# Patient Record
Sex: Female | Born: 1965 | Race: White | Hispanic: No | Marital: Married | Smoking: Former smoker
Health system: Southern US, Community
[De-identification: ages and names within clinical notes are randomized; demographics above are authoritative.]

## PROBLEM LIST (undated history)

## (undated) DIAGNOSIS — N83209 Unspecified ovarian cyst, unspecified side: Secondary | ICD-10-CM

## (undated) HISTORY — DX: Unspecified ovarian cyst, unspecified side: N83.209

## (undated) HISTORY — PX: ABDOMINAL HYSTERECTOMY: SHX81

## (undated) HISTORY — PX: NOSE SURGERY: SHX723

## (undated) HISTORY — PX: WISDOM TOOTH EXTRACTION: SHX21

---

## 1988-12-07 HISTORY — PX: NOSE SURGERY: SHX723

## 1999-01-13 ENCOUNTER — Inpatient Hospital Stay (HOSPITAL_COMMUNITY): Admission: AD | Admit: 1999-01-13 | Discharge: 1999-01-13 | Payer: Self-pay | Admitting: Obstetrics and Gynecology

## 1999-04-08 ENCOUNTER — Inpatient Hospital Stay (HOSPITAL_COMMUNITY): Admission: AD | Admit: 1999-04-08 | Discharge: 1999-04-11 | Payer: Self-pay | Admitting: Obstetrics and Gynecology

## 1999-04-08 ENCOUNTER — Encounter: Payer: Self-pay | Admitting: Obstetrics and Gynecology

## 1999-04-29 ENCOUNTER — Encounter: Admission: RE | Admit: 1999-04-29 | Discharge: 1999-07-28 | Payer: Self-pay | Admitting: Obstetrics and Gynecology

## 1999-06-22 ENCOUNTER — Inpatient Hospital Stay (HOSPITAL_COMMUNITY): Admission: AD | Admit: 1999-06-22 | Discharge: 1999-06-25 | Payer: Self-pay | Admitting: Obstetrics and Gynecology

## 1999-06-26 ENCOUNTER — Encounter (HOSPITAL_COMMUNITY): Admission: RE | Admit: 1999-06-26 | Discharge: 1999-09-24 | Payer: Self-pay | Admitting: Obstetrics and Gynecology

## 1999-07-28 ENCOUNTER — Other Ambulatory Visit: Admission: RE | Admit: 1999-07-28 | Discharge: 1999-07-28 | Payer: Self-pay | Admitting: Obstetrics and Gynecology

## 1999-10-02 ENCOUNTER — Encounter (HOSPITAL_COMMUNITY): Admission: RE | Admit: 1999-10-02 | Discharge: 1999-12-31 | Payer: Self-pay | Admitting: Obstetrics and Gynecology

## 2001-03-17 ENCOUNTER — Emergency Department (HOSPITAL_COMMUNITY): Admission: EM | Admit: 2001-03-17 | Discharge: 2001-03-17 | Payer: Self-pay | Admitting: Emergency Medicine

## 2001-06-08 ENCOUNTER — Inpatient Hospital Stay (HOSPITAL_COMMUNITY): Admission: AD | Admit: 2001-06-08 | Discharge: 2001-06-08 | Payer: Self-pay | Admitting: Obstetrics & Gynecology

## 2001-06-09 ENCOUNTER — Inpatient Hospital Stay (HOSPITAL_COMMUNITY): Admission: AD | Admit: 2001-06-09 | Discharge: 2001-06-09 | Payer: Self-pay | Admitting: Obstetrics and Gynecology

## 2001-06-18 ENCOUNTER — Inpatient Hospital Stay (HOSPITAL_COMMUNITY): Admission: AD | Admit: 2001-06-18 | Discharge: 2001-06-18 | Payer: Self-pay | Admitting: Obstetrics and Gynecology

## 2001-06-24 ENCOUNTER — Inpatient Hospital Stay (HOSPITAL_COMMUNITY): Admission: AD | Admit: 2001-06-24 | Discharge: 2001-06-24 | Payer: Self-pay | Admitting: *Deleted

## 2001-06-24 ENCOUNTER — Inpatient Hospital Stay (HOSPITAL_COMMUNITY): Admission: AD | Admit: 2001-06-24 | Discharge: 2001-06-29 | Payer: Self-pay | Admitting: Obstetrics and Gynecology

## 2001-06-27 ENCOUNTER — Encounter: Payer: Self-pay | Admitting: Obstetrics and Gynecology

## 2001-07-08 ENCOUNTER — Inpatient Hospital Stay (HOSPITAL_COMMUNITY): Admission: AD | Admit: 2001-07-08 | Discharge: 2001-07-08 | Payer: Self-pay | Admitting: Obstetrics and Gynecology

## 2001-07-10 ENCOUNTER — Inpatient Hospital Stay (HOSPITAL_COMMUNITY): Admission: AD | Admit: 2001-07-10 | Discharge: 2001-07-12 | Payer: Self-pay | Admitting: Obstetrics & Gynecology

## 2001-07-13 ENCOUNTER — Encounter: Admission: RE | Admit: 2001-07-13 | Discharge: 2001-08-12 | Payer: Self-pay | Admitting: Obstetrics & Gynecology

## 2001-08-10 ENCOUNTER — Other Ambulatory Visit: Admission: RE | Admit: 2001-08-10 | Discharge: 2001-08-10 | Payer: Self-pay | Admitting: Obstetrics and Gynecology

## 2001-08-13 ENCOUNTER — Encounter: Admission: RE | Admit: 2001-08-13 | Discharge: 2001-09-12 | Payer: Self-pay | Admitting: Obstetrics & Gynecology

## 2001-10-13 ENCOUNTER — Encounter: Admission: RE | Admit: 2001-10-13 | Discharge: 2001-11-12 | Payer: Self-pay | Admitting: Obstetrics & Gynecology

## 2001-12-13 ENCOUNTER — Encounter: Admission: RE | Admit: 2001-12-13 | Discharge: 2002-01-12 | Payer: Self-pay | Admitting: Obstetrics & Gynecology

## 2002-01-13 ENCOUNTER — Encounter: Admission: RE | Admit: 2002-01-13 | Discharge: 2002-02-12 | Payer: Self-pay | Admitting: Obstetrics & Gynecology

## 2002-03-13 ENCOUNTER — Encounter: Admission: RE | Admit: 2002-03-13 | Discharge: 2002-04-12 | Payer: Self-pay | Admitting: Obstetrics & Gynecology

## 2002-04-07 ENCOUNTER — Emergency Department (HOSPITAL_COMMUNITY): Admission: EM | Admit: 2002-04-07 | Discharge: 2002-04-08 | Payer: Self-pay | Admitting: Emergency Medicine

## 2002-04-08 ENCOUNTER — Encounter: Payer: Self-pay | Admitting: Emergency Medicine

## 2002-05-13 ENCOUNTER — Encounter: Admission: RE | Admit: 2002-05-13 | Discharge: 2002-06-12 | Payer: Self-pay | Admitting: Obstetrics & Gynecology

## 2002-07-13 ENCOUNTER — Encounter: Admission: RE | Admit: 2002-07-13 | Discharge: 2002-08-12 | Payer: Self-pay | Admitting: Obstetrics & Gynecology

## 2002-08-11 ENCOUNTER — Other Ambulatory Visit: Admission: RE | Admit: 2002-08-11 | Discharge: 2002-08-11 | Payer: Self-pay | Admitting: Obstetrics and Gynecology

## 2002-08-13 ENCOUNTER — Encounter: Admission: RE | Admit: 2002-08-13 | Discharge: 2002-09-12 | Payer: Self-pay | Admitting: Obstetrics & Gynecology

## 2002-10-13 ENCOUNTER — Encounter: Admission: RE | Admit: 2002-10-13 | Discharge: 2002-11-12 | Payer: Self-pay | Admitting: Obstetrics & Gynecology

## 2003-02-10 ENCOUNTER — Emergency Department (HOSPITAL_COMMUNITY): Admission: EM | Admit: 2003-02-10 | Discharge: 2003-02-10 | Payer: Self-pay | Admitting: Emergency Medicine

## 2008-12-07 HISTORY — PX: WISDOM TOOTH EXTRACTION: SHX21

## 2011-12-08 HISTORY — PX: ABDOMINAL HYSTERECTOMY: SHX81

## 2012-12-07 HISTORY — PX: COLONOSCOPY: SHX174

## 2014-10-26 ENCOUNTER — Ambulatory Visit: Payer: Self-pay | Admitting: Gynecology

## 2014-10-26 ENCOUNTER — Telehealth: Payer: Self-pay | Admitting: *Deleted

## 2014-10-26 NOTE — Telephone Encounter (Signed)
Pt called to cancel appt she was able to find an oncologist closer at [place] cancelled per pt request

## 2017-03-11 DIAGNOSIS — N838 Other noninflammatory disorders of ovary, fallopian tube and broad ligament: Secondary | ICD-10-CM | POA: Insufficient documentation

## 2017-11-17 DIAGNOSIS — K5904 Chronic idiopathic constipation: Secondary | ICD-10-CM | POA: Insufficient documentation

## 2017-12-30 LAB — HM COLONOSCOPY

## 2021-05-19 ENCOUNTER — Other Ambulatory Visit: Payer: Self-pay

## 2021-05-19 ENCOUNTER — Ambulatory Visit
Admission: RE | Admit: 2021-05-19 | Discharge: 2021-05-19 | Disposition: A | Discharge status: Home / Self Care | Payer: BC Managed Care – PPO | Attending: Family Medicine | Admitting: Family Medicine

## 2021-05-19 ENCOUNTER — Encounter: Payer: Self-pay | Admitting: Family Medicine

## 2021-05-19 ENCOUNTER — Ambulatory Visit
Admission: RE | Admit: 2021-05-19 | Discharge: 2021-05-19 | Disposition: A | Discharge status: Home / Self Care | Payer: BC Managed Care – PPO | Source: Ambulatory Visit | Attending: Family Medicine | Admitting: Family Medicine

## 2021-05-19 ENCOUNTER — Ambulatory Visit: Payer: BC Managed Care – PPO | Admitting: Family Medicine

## 2021-05-19 VITALS — BP 98/64 | HR 84 | Temp 98.3°F | Ht 62.5 in | Wt 166.0 lb

## 2021-05-19 DIAGNOSIS — M533 Sacrococcygeal disorders, not elsewhere classified: Secondary | ICD-10-CM | POA: Insufficient documentation

## 2021-05-19 DIAGNOSIS — M25532 Pain in left wrist: Secondary | ICD-10-CM

## 2021-05-19 DIAGNOSIS — M62838 Other muscle spasm: Secondary | ICD-10-CM

## 2021-05-19 DIAGNOSIS — M4722 Other spondylosis with radiculopathy, cervical region: Secondary | ICD-10-CM | POA: Insufficient documentation

## 2021-05-19 HISTORY — DX: Sacrococcygeal disorders, not elsewhere classified: M53.3

## 2021-05-19 MED ORDER — CYCLOBENZAPRINE HCL 10 MG PO TABS: ORAL_TABLET | ORAL | 0 refills | Status: DC

## 2021-05-19 MED ORDER — MELOXICAM 15 MG PO TABS: 15.0000 mg | ORAL_TABLET | Freq: Every day | ORAL | 0 refills | Status: DC

## 2021-05-19 NOTE — Progress Notes (Signed)
Primary Care / Sports Medicine Office Visit  Patient Information:  Patient ID: Charlene Keith, female DOB: 1966/02/18 Age: 55 y.o. MRN: [id number]   Jean Alejos is a pleasant 55 y.o. female presenting with the following:  Chief Complaint  Patient presents with   New Patient (Initial Visit)   Shoulder Pain    Left; x3 weeks ago, occurred when moving boxes; patient went to chiropractor for neck alignment; saw PCP in [facility] who gave patient prednisone with no relief; right-handedness; 6-8/10 pain   Back Pain    Lower; new onset, x2 days; no known injury; history of fractured coccyx when patient was 20 and was bedridden for 6 months; uses Salonpas  and Tylenol extra strength for relief; 5/10 pain    Review of Systems pertinent details above   Patient Active Problem List   Diagnosis Date Noted   Pain of left sacroiliac joint 05/19/2021   Cervical paraspinal muscle spasm 05/19/2021   Chronic idiopathic constipation 11/17/2017   Ovarian mass 03/11/2017   Past Medical History:  Diagnosis Date   Ovarian cyst    Outpatient Encounter Medications as of 05/19/2021  Medication Sig   Cholecalciferol 50 MCG (2000 UT) CAPS Take 1 capsule by mouth daily.   cyclobenzaprine (FLEXERIL) 10 MG tablet One half to one tab PO qHS, then increase gradually to one tab TID.   estradiol (CLIMARA - DOSED IN MG/24 HR) 0.05 mg/24hr patch Place 1 patch onto the skin once a week.   fluticasone (CUTIVATE) 0.005 % ointment Apply 1 application topically as needed.   Magnesium 500 MG TABS Take 500 mg by mouth daily.   meloxicam (MOBIC) 15 MG tablet Take 1 tablet (15 mg total) by mouth daily.   tretinoin (RETIN-A) 0.025 % cream Apply 1 application topically at bedtime.   vitamin B-12 (CYANOCOBALAMIN) 1000 MCG tablet Take 1,000 mcg by mouth daily.   No facility-administered encounter medications on file as of 05/19/2021.   Past Surgical History:  Procedure Laterality Date   ABDOMINAL HYSTERECTOMY     NOSE  SURGERY     WISDOM TOOTH EXTRACTION Bilateral     Vitals:   05/19/21 1340  BP: 98/64  Pulse: 84  Temp: 98.3 F (36.8 C)  SpO2: 96%   Vitals:   05/19/21 1340  Weight: 166 lb (75.3 kg)  Height: 5' 2.5" (1.588 m)   Body mass index is 29.88 kg/m.  No results found.   Independent interpretation of notes and tests performed by another provider:   None  Procedures performed:   None  Pertinent History, Exam, Impression, and Recommendations:   Pain of left sacroiliac joint Patient with stated history of low back pain/chronic issue with recent exacerbation that she attributes to recent move to the area.  She also gives a history of coccyx fracture as a child.  She denies any significant radiation of symptoms, no paresthesias, no weakness in the lower extremities.  Physical exam is significant for focal left SI joint pain, paraspinal right greater than left lower lumbar pain, tenderness at the gluteus medius insertion at the greater trochanter and on the greater trochanter of the left hip.  Straight leg raise testing is negative bilaterally.  Her clinical features are most consistent with lumbosacral etiology with secondary/compensatory changes as demonstrated above.  Plan for lumbar films, scheduled meloxicam and cyclobenzaprine, relative rest, and follow-up.  Pending focality of symptoms at return and x-ray results, can consider local ultrasound-guided injections.  Once appropriate, patient would benefit from home-based or  formal physical therapy.  Cervical paraspinal muscle spasm Patient with several weeks of stated left "shoulder pain".  Some radiation to the left pectoralis and left neck, this is in the setting of stated chronic issues at the cervical spine.  She has noted involvement into the forearm but is actively dealing with carpal tunnel syndrome ipsilaterally, per patient.  Her physical exam findings reveal full painless left shoulder motion, no discomfort with resisted  rotator cuff testing, negative impingement, and tenderness at the left trapezius.  Cervical range of motion is full with mild pain localized to the left trapezius, negative Spurling's bilaterally.  Given her history and findings today, concern for underlying cervical etiology manifesting with left paraspinal cervical muscle spasm.  Plan for x-rays of the cervical spine and left shoulder, interim scheduled meloxicam and cyclobenzaprine, relative rest, and close follow-up in 2 weeks.  Pending radiographic findings and her clinical status at return, can consider local injections.  Once appropriate, she would benefit from home-based for formal physical therapy.    Orders & Medications Meds ordered this encounter  Medications   meloxicam (MOBIC) 15 MG tablet    Sig: Take 1 tablet (15 mg total) by mouth daily.    Dispense:  30 tablet    Refill:  0   cyclobenzaprine (FLEXERIL) 10 MG tablet    Sig: One half to one tab PO qHS, then increase gradually to one tab TID.    Dispense:  30 tablet    Refill:  0   Orders Placed This Encounter  Procedures   DG Cervical Spine Complete   DG Shoulder Left   DG Lumbar Spine Complete   DG Wrist Complete Left     Return in about 2 weeks (around 06/02/2021) for 40 minutes for possible injections.     Montel Culver, MD   Primary Care Sports [facility] [place]

## 2021-05-19 NOTE — Patient Instructions (Signed)
-   Start meloxicam and dose once daily until follow-up (take with food) - Start nightly cyclobenzaprine, side effect and be drowsiness, additional doses can be taken every 8 hours on as-needed basis - Obtain x-rays with the orders provided - Gentle activity using symptoms as a guide - return for follow-up in 2 weeks, contact for questions between now

## 2021-05-19 NOTE — Assessment & Plan Note (Signed)
Patient with stated history of low back pain/chronic issue with recent exacerbation that she attributes to recent move to the area.  She also gives a history of coccyx fracture as a child.  She denies any significant radiation of symptoms, no paresthesias, no weakness in the lower extremities.  Physical exam is significant for focal left SI joint pain, paraspinal right greater than left lower lumbar pain, tenderness at the gluteus medius insertion at the greater trochanter and on the greater trochanter of the left hip.  Straight leg raise testing is negative bilaterally.  Her clinical features are most consistent with lumbosacral etiology with secondary/compensatory changes as demonstrated above.  Plan for lumbar films, scheduled meloxicam and cyclobenzaprine, relative rest, and follow-up.  Pending focality of symptoms at return and x-ray results, can consider local ultrasound-guided injections.  Once appropriate, patient would benefit from home-based or formal physical therapy.

## 2021-05-19 NOTE — Assessment & Plan Note (Signed)
Patient with several weeks of stated left "shoulder pain".  Some radiation to the left pectoralis and left neck, this is in the setting of stated chronic issues at the cervical spine.  She has noted involvement into the forearm but is actively dealing with carpal tunnel syndrome ipsilaterally, per patient.  Her physical exam findings reveal full painless left shoulder motion, no discomfort with resisted rotator cuff testing, negative impingement, and tenderness at the left trapezius.  Cervical range of motion is full with mild pain localized to the left trapezius, negative Spurling's bilaterally.  Given her history and findings today, concern for underlying cervical etiology manifesting with left paraspinal cervical muscle spasm.  Plan for x-rays of the cervical spine and left shoulder, interim scheduled meloxicam and cyclobenzaprine, relative rest, and close follow-up in 2 weeks.  Pending radiographic findings and her clinical status at return, can consider local injections.  Once appropriate, she would benefit from home-based for formal physical therapy.

## 2021-05-19 NOTE — Assessment & Plan Note (Signed)
>>  ASSESSMENT AND PLAN FOR CERVICAL PARASPINAL MUSCLE SPASM WRITTEN ON 05/19/2021  2:59 PM BY Nira Visscher, Ocie Bob, MD  Patient with several weeks of stated left "shoulder pain".  Some radiation to the left pectoralis and left neck, this is in the setting of stated chronic issues at the cervical spine.  She has noted involvement into the forearm but is actively dealing with carpal tunnel syndrome ipsilaterally, per patient.  Her physical exam findings reveal full painless left shoulder motion, no discomfort with resisted rotator cuff testing, negative impingement, and tenderness at the left trapezius.  Cervical range of motion is full with mild pain localized to the left trapezius, negative Spurling's bilaterally.  Given her history and findings today, concern for underlying cervical etiology manifesting with left paraspinal cervical muscle spasm.  Plan for x-rays of the cervical spine and left shoulder, interim scheduled meloxicam and cyclobenzaprine, relative rest, and close follow-up in 2 weeks.  Pending radiographic findings and her clinical status at return, can consider local injections.  Once appropriate, she would benefit from home-based for formal physical therapy.

## 2021-05-21 ENCOUNTER — Telehealth: Payer: Self-pay | Admitting: Family Medicine

## 2021-05-21 NOTE — Telephone Encounter (Signed)
See X-Ray result note in patient's chart.    Copied from [place] [id number]. Topic: General - Call Back - No Documentation >> May 21, 2021 10:24 AM Parke Poisson wrote: Reason for CRM:Pt states she is returning call to office.Possibly may be concerning her x ray result per pt

## 2021-05-23 NOTE — Progress Notes (Signed)
Per Epic patient reviews labs on my chart.

## 2021-06-02 ENCOUNTER — Ambulatory Visit: Payer: BC Managed Care – PPO | Admitting: Family Medicine

## 2021-06-02 ENCOUNTER — Encounter: Payer: Self-pay | Admitting: Family Medicine

## 2021-06-02 ENCOUNTER — Other Ambulatory Visit: Payer: Self-pay

## 2021-06-02 VITALS — BP 98/62 | HR 88 | Temp 98.2°F | Ht 62.5 in | Wt 167.0 lb

## 2021-06-02 DIAGNOSIS — M47812 Spondylosis without myelopathy or radiculopathy, cervical region: Secondary | ICD-10-CM

## 2021-06-02 DIAGNOSIS — M25532 Pain in left wrist: Secondary | ICD-10-CM

## 2021-06-02 DIAGNOSIS — M62838 Other muscle spasm: Secondary | ICD-10-CM

## 2021-06-02 DIAGNOSIS — G5602 Carpal tunnel syndrome, left upper limb: Secondary | ICD-10-CM | POA: Insufficient documentation

## 2021-06-02 DIAGNOSIS — M25332 Other instability, left wrist: Secondary | ICD-10-CM

## 2021-06-02 DIAGNOSIS — M4722 Other spondylosis with radiculopathy, cervical region: Secondary | ICD-10-CM | POA: Insufficient documentation

## 2021-06-02 DIAGNOSIS — M533 Sacrococcygeal disorders, not elsewhere classified: Secondary | ICD-10-CM

## 2021-06-02 MED ORDER — CYCLOBENZAPRINE HCL 10 MG PO TABS: 10.0000 mg | ORAL_TABLET | Freq: Every evening | ORAL | 0 refills | Status: DC | PRN

## 2021-06-02 MED ORDER — MELOXICAM 15 MG PO TABS: 15.0000 mg | ORAL_TABLET | Freq: Every day | ORAL | 0 refills | Status: DC | PRN

## 2021-06-02 NOTE — Assessment & Plan Note (Signed)
Radiographs reveal subtle degenerative changes at the left inferior SI joint, on physical exam she has mild, improved, tenderness at bilateral SI joints.  Is a chronic issue with recent improvement.  I have advised her to monitor this issue given the limited symptomatology and reassuring physical exam findings.  She can contact us on an as-needed basis for this, if the need arises she may benefit from ultrasound-guided SI joint injection(s)

## 2021-06-02 NOTE — Assessment & Plan Note (Signed)
This has improved, physical exam findings are consistent with same, as needed cyclobenzaprine as an adjunct to physical therapy advised.

## 2021-06-02 NOTE — Assessment & Plan Note (Signed)
Patient with chronic history of left carpal tunnel syndrome, recent radiographs reveal scapholunate space widening which may be a confounding element in her wrist mechanics and resultant median nerve involvement.  I have advised continued night splinting, wean from wakeful hour brace usage as she progresses with physical therapy.  A referral to physical therapy was placed.  She can also continue meloxicam on an as-needed basis.  She will return for follow-up in 6 weeks for reevaluation, if persistent symptomatology noted without adequate response, can consider ultrasound-guided median nerve Hydro dissection.

## 2021-06-02 NOTE — Progress Notes (Addendum)
Primary Care / Sports Medicine Office Visit  Patient Information:  Patient ID: Charlene Keith, female DOB: 1966/03/20 Age: 55 y.o. MRN: [id number]   Charlene Keith is a pleasant 55 y.o. 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   estradiol (CLIMARA - DOSED IN MG/24 HR) 0.05 mg/24hr patch Place 1 patch onto the skin once a week.   fluticasone (CUTIVATE) 0.005 % ointment Apply 1 application topically as needed.   Magnesium 500 MG TABS Take 500 mg by mouth daily.   tretinoin (RETIN-A) 0.025 % cream Apply 1 application topically at bedtime.   vitamin B-12 (CYANOCOBALAMIN) 1000 MCG tablet Take 1,000 mcg by mouth daily.   [DISCONTINUED] cyclobenzaprine (FLEXERIL) 10 MG tablet One half to one tab PO qHS, then increase gradually to one tab TID.   [DISCONTINUED] meloxicam (MOBIC) 15 MG tablet Take 1 tablet (15 mg total) by mouth daily.   cyclobenzaprine (FLEXERIL) 10 MG tablet Take 1 tablet (10 mg total) by mouth at bedtime as needed for  muscle spasms. One half to one tab PO qHS, then increase gradually to one tab TID.   meloxicam (MOBIC) 15 MG tablet Take 1 tablet (15 mg total) by mouth daily as needed for pain.   No facility-administered encounter medications on file as of 06/02/2021.   Past Surgical History:  Procedure Laterality Date   ABDOMINAL HYSTERECTOMY     NOSE SURGERY     WISDOM TOOTH EXTRACTION Bilateral     Vitals:   06/02/21 0941  BP: 98/62  Pulse: 88  Temp: 98.2 F (36.8 C)  SpO2: 97%   Vitals:   06/02/21 0941  Weight: 167 lb (75.8 kg)  Height: 5' 2.5" (1.588 m)   Body mass index is 30.06 kg/m.  DG Cervical Spine Complete  Result Date: 05/20/2021 CLINICAL DATA:  Paraspinal left greater than right spasm. EXAM: CERVICAL SPINE - COMPLETE 4+ VIEW COMPARISON:  None. FINDINGS: Slight reversal of the normal cervical lordosis. Slight retrolisthesis of C5 on C6, likely degenerative given degenerative changes at this level. Lateral masses of C1 and C2 are aligned on the open-mouth odontoid view. Mild height loss of the T1 vertebral body anteriorly. Vertebral body heights are maintained. Moderate degenerative disc disease at C5-C6 with disc height loss and endplate spurring. Prevertebral soft tissues are within normal limits. IMPRESSION: 1. Mild height loss of the T1 vertebral body anteriorly, which could represent degenerative remodeling or age-indeterminant superior endplate fracture. If the patient is focally tender or has a history of recent trauma then a CT  or MRI of the thoracic spine is recommended to further evaluate. 2. Moderate degenerative disc disease at C5-C6. Electronically Signed   By: Margaretha Sheffield MD   On: 05/20/2021 09:17   DG Lumbar Spine Complete  Result Date: 05/20/2021 CLINICAL DATA:  Low back pain after bending over yesterday. EXAM: LUMBAR SPINE - COMPLETE 4+ VIEW COMPARISON:  None. FINDINGS: Five lumbar type vertebral bodies. No acute fracture or subluxation. Vertebral body heights are  preserved. Alignment is normal. Mild disc height loss at T11-T12, L3-L4, and L4-L5. Mild right facet arthropathy at L4-L5. The sacroiliac joints are unremarkable. IMPRESSION: 1. Mild degenerative disc disease at L3-L4 and L4-L5. Electronically Signed   By: Titus Dubin M.D.   On: 05/20/2021 09:15   DG Wrist Complete Left  Result Date: 05/20/2021 CLINICAL DATA:  Left wrist pain. EXAM: LEFT WRIST - COMPLETE 3+ VIEW COMPARISON:  None. FINDINGS: No acute fracture or dislocation. Widening of the scapholunate interval with borderline increased scapholunate angle. Joint spaces are preserved. Bone mineralization is normal. Soft tissues are unremarkable. IMPRESSION: 1. Findings suggestive of scapholunate ligament injury. Electronically Signed   By: Titus Dubin M.D.   On: 05/20/2021 09:18   DG Shoulder Left  Result Date: 05/20/2021 CLINICAL DATA:  Left shoulder pain for the past 3 weeks after moving and lifting. EXAM: LEFT SHOULDER - 2+ VIEW COMPARISON:  None. FINDINGS: There is no evidence of fracture or dislocation. There is no evidence of arthropathy or other focal bone abnormality. Soft tissues are unremarkable. IMPRESSION: Negative. Electronically Signed   By: Titus Dubin M.D.   On: 05/20/2021 09:14     Independent interpretation of notes and tests performed by another provider:   Independent interpretation of the cervical spine films dated 05/19/2021 reveal straightening and near reversal of the expected cervical lordosis, focal intervertebral narrowing at C5-C6 with subtle anterior endplate osteophyte, subtle right-sided foraminal narrowing at the same levels, no acute osseous process identified  Independent interpretation of left shoulder x-ray dated 05/19/2021 reveals no significant degenerative changes at the acromioclavicular or glenohumeral articulations, no malalignment, type I acromion, no acute osseous process identified  Independent interpretation of lumbar spine x-ray dated 05/19/2021  reveals subtle cortical roughening at the inferior aspect of the left SI joint consistent with degenerative changes, anterior endplate osteophyte at Y3-0 and facet hypertrophy with sclerosis at L4-5 and L5-S1, no acute osseous process identified  Independent interpretation of left wrist x-ray dated 05/19/2021 reveals widening of the scapholunate junction, subtle first [facility] degenerative changes   Procedures performed:   None  Pertinent History, Exam, Impression, and Recommendations:   Carpal tunnel syndrome of left wrist Patient with chronic history of left carpal tunnel syndrome, recent radiographs reveal scapholunate space widening which may be a confounding element in her wrist mechanics and resultant median nerve involvement.  I have advised continued night splinting, wean from wakeful hour brace usage as she progresses with physical therapy.  A referral to physical therapy was placed.  She can also continue meloxicam on an as-needed basis.  She will return for follow-up in 6 weeks for reevaluation, if persistent symptomatology noted without adequate response, can consider ultrasound-guided median nerve Hydro dissection.  Cervical spondylosis Patient has demonstrated excellent interval response from neck/shoulder reported pain, recent radiographs reveal focal intervertebral involvement at C5-6, her physical exam shows full painless cervical and shoulder range of motion and she is nontender throughout.  She does give history of chronicity of this issue ongoing for years, currently improved.  Given her excellent  clinical picture today I have advised her on several maintenance strategies and she is amenable to formal physical therapy, home-based rehab, and as needed meloxicam and cyclobenzaprine.  Additional refills were prescribed and appropriate dosing discussed at length, they are to serve as an adjunct to her physical therapy course.  She will return for follow-up in 6 weeks for  reevaluation.  Pain of left sacroiliac joint Radiographs reveal subtle degenerative changes at the left inferior SI joint, on physical exam she has mild, improved, tenderness at bilateral SI joints.  Is a chronic issue with recent improvement.  I have advised her to monitor this issue given the limited symptomatology and reassuring physical exam findings.  She can contact us on an as-needed basis for this, if the need arises she may benefit from ultrasound-guided SI joint injection(s)  Cervical paraspinal muscle spasm This has improved, physical exam findings are consistent with same, as needed cyclobenzaprine as an adjunct to physical therapy advised.  Scapholunate dissociation of left wrist Recent radiographs do reveal widening of the scapholunate junction on the left wrist, she does have mild focal tenderness at her midcarpal region however her exam is most consistent with carpal tunnel syndrome with the incidentally noted scapholunate widening being a possible confounding/contributory factor.  I have advised formal physical therapy and appropriate brace usage, we can review her symptoms and response in 6 weeks.  Pending clinical features at her return, we can determine the benefit of ultrasound-guided scapholunate injection and/or median nerve Hydro dissection.    Orders & Medications Meds ordered this encounter  Medications   meloxicam (MOBIC) 15 MG tablet    Sig: Take 1 tablet (15 mg total) by mouth daily as needed for pain.    Dispense:  30 tablet    Refill:  0   cyclobenzaprine (FLEXERIL) 10 MG tablet    Sig: Take 1 tablet (10 mg total) by mouth at bedtime as needed for muscle spasms. One half to one tab PO qHS, then increase gradually to one tab TID.    Dispense:  30 tablet    Refill:  0   Orders Placed This Encounter  Procedures   Ambulatory referral to Physical Therapy     Return in about 6 weeks (around 07/14/2021).     Montel Culver, MD   Primary Care Sports  Medicine [place]

## 2021-06-02 NOTE — Assessment & Plan Note (Signed)
Recent radiographs do reveal widening of the scapholunate junction on the left wrist, she does have mild focal tenderness at her midcarpal region however her exam is most consistent with carpal tunnel syndrome with the incidentally noted scapholunate widening being a possible confounding/contributory factor.  I have advised formal physical therapy and appropriate brace usage, we can review her symptoms and response in 6 weeks.  Pending clinical features at her return, we can determine the benefit of ultrasound-guided scapholunate injection and/or median nerve Hydro dissection.

## 2021-06-02 NOTE — Assessment & Plan Note (Addendum)
>>  ASSESSMENT AND PLAN FOR CERVICAL SPONDYLOSIS WITH RADICULOPATHY WRITTEN ON 06/02/2021 10:29 AM BY Kylan Liberati, Ocie Bob, MD  Patient has demonstrated excellent interval response from neck/shoulder reported pain, recent radiographs reveal focal intervertebral involvement at C5-6, her physical exam shows full painless cervical and shoulder range of motion and she is nontender throughout.  She does give history of chronicity of this issue ongoing for years, currently improved.  Given her excellent clinical picture today I have advised her on several maintenance strategies and she is amenable to formal physical therapy, home-based rehab, and as needed meloxicam and cyclobenzaprine.  Additional refills were prescribed and appropriate dosing discussed at length, they are to serve as an adjunct to her physical therapy course.  She will return for follow-up in 6 weeks for reevaluation.  >>ASSESSMENT AND PLAN FOR CERVICAL PARASPINAL MUSCLE SPASM WRITTEN ON 06/02/2021 10:31 AM BY Haly Feher, Ocie Bob, MD  This has improved, physical exam findings are consistent with same, as needed cyclobenzaprine as an adjunct to physical therapy advised.

## 2021-06-02 NOTE — Patient Instructions (Addendum)
-   Start physical therapy for neck and wrist - Use meloxicam and cyclobenzaprine on an as-needed basis - Contact for questions - Return in 6 weeks  Follow-up with OBGYN: OBGYN Dr. Alanda Slim Cousins   [phone]

## 2021-06-11 ENCOUNTER — Ambulatory Visit: Payer: BC Managed Care – PPO | Attending: Family Medicine

## 2021-06-11 ENCOUNTER — Other Ambulatory Visit: Payer: Self-pay

## 2021-06-11 DIAGNOSIS — M79642 Pain in left hand: Secondary | ICD-10-CM | POA: Insufficient documentation

## 2021-06-11 DIAGNOSIS — M79602 Pain in left arm: Secondary | ICD-10-CM | POA: Diagnosis present

## 2021-06-11 DIAGNOSIS — M6281 Muscle weakness (generalized): Secondary | ICD-10-CM | POA: Insufficient documentation

## 2021-06-11 NOTE — Patient Instructions (Addendum)
Access Code: [email or web address] URL: [email or web address] Date: 06/11/2021 Prepared by: Roxana Hires  Exercises Doorway Pec Stretch at 60 Degrees Abduction with Arm Straight (Mirrored) - 2 x daily - 7 x weekly - 3 reps - 30s hold Doorway Pec Stretch at 120 Elevation with Arm Straight (Mirrored) - 2 x daily - 7 x weekly - 3 reps - 30s hold Median Nerve Flossing - Tray - 2 x daily - 7 x weekly - 2 sets - 10 reps - 5s hold

## 2021-06-11 NOTE — Therapy (Signed)
[facility] [address] [place], [place], [address] Phone: [phone]   Fax:  [phone]  Physical Therapy Evaluation  Patient Details  Name: Charlene Keith MRN: [id number] Date of Birth: April 05, 1966 Referring Provider (PT): Dr. Zigmund Daniel  Encounter Date: 06/11/2021   PT End of Session - 06/13/21 1548     Visit Number 1    Number of Visits 17    Date for PT Re-Evaluation 08/06/21    Authorization Type eval; 06/11/21    PT Start Time 1400    PT Stop Time 1450    PT Time Calculation (min) 50 min    Activity Tolerance Patient tolerated treatment well    Behavior During Therapy [facility] for tasks assessed/performed             Past Medical History:  Diagnosis Date   Ovarian cyst     Past Surgical History:  Procedure Laterality Date   ABDOMINAL HYSTERECTOMY     NOSE SURGERY     WISDOM TOOTH EXTRACTION Bilateral     There were no vitals filed for this visit.    Subjective Assessment - 06/13/21 1547     Subjective LUE pain    Pertinent History Pt states that she moved into a new house the first week in June. The week prior she was packing up her belongings when she started to experience LUE pain. She has continued to experience L arm pain which is particularly aggravated when lifting with her left upper extremity.  She currently complains of left upper extremity weakness as well.  Pain has improved slightly since it started but is still intermittently painful. She also notes that the pain worsens when it is cold, when she is driving, and when she lays on her L side. Symptoms improve with TENS, medication (Tylenol and Mobic), stretching her L shoulder, ice, massage, and keeping her arm covered with warm clothing. She complains of numbness and tingling in digits 4-5 on her L hand. Approximately 1 month ago she was bit by a spider on her L hand causing pain and swelling. She was prescribed steroids and symptoms have since resolved.  She  saw Dr. Rosette Reveal who diagnosed her with cervical spondylosis (chronic) as well as carpal tunnel syndrome of her left wrist.  Per medical notes recent radiographs reveal scapholunate space widening which may be a confounding element in her wrist mechanics and resultant median nerve involvement.  She was advised to utilize night splinting and wean from wakeful hour brace usage as she progresses with PT. Recent cervical radiographs revealed focal intervertebral involvement at C5-6.  She has seen a chiropractor for her neck pain with the most recent visit being May 16, 2021.  She describes receiving what sounds like cervical traction but it did not have any effect on her left upper extremity pain.  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    Limitations Lifting    Diagnostic tests See history    Patient Stated Goals Decreased LUE pain and improve strength    Currently in Pain? Yes    Pain Score 5    Worst: 10/10, best: 0/10   Pain Location Arm    Pain Orientation Left    Pain Descriptors / Indicators Aching    Pain Type Chronic pain    Pain Radiating Towards down LUE but unclear if it is constant  or starts in one location and runs down the arm    Pain Onset More than a month ago    Pain Frequency Intermittent    Aggravating Factors  lifting, laying on L side, driving, cold air    Pain Relieving Factors TENS, medication (Tylenol and Mobic), stretching L shoulder, ice, massage, putting on a sweater.                 [facility] PT Assessment - 06/13/21 1548       Assessment   Medical Diagnosis Cervical spondylosis, scapholunate dissociation of left wrist, carpal tunnel syndrome of left wrist    Referring Provider (PT) Dr. Zigmund Daniel    Onset Date/Surgical Date 04/28/21   Approximate   Hand Dominance Right   Patient also uses left hand for some activities   Next MD Visit 07/14/2021    Prior  Therapy None for this issue      Precautions   Precautions None      Restrictions   Weight Bearing Restrictions No      Balance Screen   Has the patient fallen in the past 6 months No                SUBJECTIVE   History: Pt states that she moved into a new house the first week in June. The week prior she was packing up her belongings when she started to experience LUE pain. She has continued to experience L arm pain which is particularly aggravated when lifting with her left upper extremity.  She currently complains of left upper extremity weakness as well.  Pain has improved slightly since it started but is still intermittently painful. She also notes that the pain worsens when it is cold, when she is driving, and when she lays on her L side. Symptoms improve with TENS, medication (Tylenol and Mobic), stretching her L shoulder, ice, massage, and keeping her arm covered with warm clothing. She complains of numbness and tingling in digits 4-5 on her L hand. Approximately 1 month ago she was bit by a spider on her L hand causing pain and swelling. She was prescribed steroids and symptoms have since resolved.  She saw Dr. Rosette Reveal who diagnosed her with cervical spondylosis (chronic) as well as carpal tunnel syndrome of her left wrist.  Per medical notes recent radiographs reveal scapholunate space widening which may be a confounding element in her wrist mechanics and resultant median nerve involvement.  She was advised to utilize night splinting and wean from wakeful hour brace usage as she progresses with PT. Recent cervical radiographs revealed focal intervertebral involvement at C5-6.  She has seen a chiropractor for her neck pain with the most recent visit being May 16, 2021.  She describes receiving what sounds like cervical traction but it did not have any effect on her left upper extremity pain.  She has a history of chronic low back pain with left SIJ involvement as well as a  history of R carpal tunnel syndrome which has been improving.  Recent shoulder/elbow/hand trauma: No Prior history of LUE injury or pain: No Imaging: Yes, see history  Pain quality: aching Pain: Present: 5/10, Best: 0/10, Worst: 10/10; Aggravating factors: lifting, laying on L side, driving, cold air Easing factors: TENS, medication (Tylenol and Mobic), stretching L shoulder, ice, massage, putting on a sweater.  Improving/Worsening: Symptoms are improving;  24 hour pain behavior: worse at the end of the day Radiating pain: Yes, down LUE to the hand; Numbness/Tingling:  Yes, tingling in digits 4 and 5 of L hand Follow-up appointment with MD: Yes, August 8th Dominant hand: right but also uses L hand for a lot of activities.     OBJECTIVE  Mental Status Patient is oriented to person, place and time.  Recent memory is intact.  Remote memory is intact.  Attention span and concentration are intact.  Expressive speech is intact.  Patient's fund of knowledge is within normal limits for educational level.   MUSCULOSKELETAL: Tremor: None Bulk: Normal Tone: Normal  Posture Mild forward head and rounded shoulders in sitting. No gross asymmetry noted with shoulder height or resting shoulder position from left to right;    Strength R/L 4+/4* Shoulder flexion (anterior deltoid/pec major/coracobrachialis, axillary n. (C5/6) and musculocutaneous n. (C5-7)) 5/4+* Shoulder abduction (deltoid/supraspinatus, axillary/suprascapular n, C5) Deferred: Shoulder external rotation (infraspinatus/teres minor) Deferred: Shoulder internal rotation (subcapularis/lats/pec major) Deferred: Shoulder extension (posterior deltoid, lats, teres major, axillary/thoracodorsal n.) Deferred: Shoulder horizontal abduction 5/4+ Elbow flexion (biceps brachii, brachialis, brachioradialis, musculoskeletal n, C5/6) 5/4+ Elbow extension (triceps, radial n, C7) 5/4+ Forearm Pronation 5/4+ Forearm Supination 5/5 Wrist  Extension (C6/7) 5/5 Wrist Flexion (C6/7) 5/5 Finger adduction (interossei, ulnar n, T1) 5/5 Finger abduction Thumb extension: weak on L side; Cervical isometrics are strong and painless in all directions; Grip strength: R: 39.6, 33.0, 30.6 (34.4#)  L: 21.8, 12.3, 16.4 (16.9#)  AROM Full and painless cervical, L shoulder, and L elbow AROM with overpressure in all planes.   Sensation Fully intact sensation to entire RUE. Pt reports diminished sensation with light touch to L C5-T1. Reports significant numbness in digits 3 and 4 of L hand.   Palpation Pt is very tender to palpation along L pec major/minor as well as over L wrist.   Passive Accessory Intervertebral Motion (PAIVM) Grossly normal mobility without reproduction of LUE pain with CPA and R UPA C2-T3. She reports reproduction of LUE pain with L UPA from L4-L6.  Reflex Testing Biceps (C5/6): R: 1+ L: absent Brachioradialis (C5/6): R: 1+ L: 1+ Triceps (C7): R: Not tested L: Not tested  SPECIAL TESTS Spurlings A (ipsilateral lateral flexion/axial compression): R: Negative L: Negative Spurlings B (ipsilateral lateral flexion/contralateral rotation/axial compression): R: Negative L: Negative Distraction Test: Negative  Hoffman Sign (cervical cord compression): R: Negative L: Positive ULTT Median: R: Not examined L: Positive ULTT Ulnar: R: Negative L: Positive ULTT Radial: R: Negative L: Negative Tinel positive for L wrist;     ASSESSMENT Clinical Impression: Pt is a pleasant 55 year-old female referred for cervical spondylosis, scapholunate dissociation of left wrist, and carpal tunnel syndrome of left wrist. She does appear to have symptoms of carpal tunnel in her L hand however arrives today complaining of more generalized pain in entire LUE with numbness to light touch sensation testing along dermatomes C5-T1. She complains particularly of numbness in digits 4 and 5 of L hand. Pt complains of pain in her chest near pec minor  with reproduction of her LUE symptoms during palpation. Spurlings A/B negative and she has full and painless AROM with overpressure of cervical spine, L shoulder, and L elbow. However she does have weakness in L shoulder flexion and abduction as well as L elbow flexion and extension compared to the R side. Her L grip strength is diminished significantly averaging 16.9# compared to 34.4# on the R side. She reports reproduction of her LUE symptoms with L unilateral passive accessory mobility testing of C4-6. Absent L bicep jerk reflex (L brachioradialis is intact). Concern present  for nerve compression either in the neck or more distal along the brachial plexus. Initiated HEP today and pt will return for additional testing at next visit. Pt presents with deficits in strength, sensation, and pain. She will benefit from skilled PT services to address deficits and return to pain-free function at home and work.         Objective measurements completed on examination: See above findings.                 PT Short Term Goals - 06/13/21 1551       PT SHORT TERM GOAL #1   Title Pt will be independent with HEP in order to improve strength and decrease L arm/hand in order to improve pain-free function at home and work.    Time 4    Period Weeks    Status New    Target Date 07/09/21               PT Long Term Goals - 06/13/21 1551       PT LONG TERM GOAL #1   Title Pt will decrease worst L arm/hand pain as reported on NPRS by at least 3 points in order to demonstrate clinically significant reduction in arm/hand pain.    Baseline 06/11/21: worst: 10/10    Time 8    Period Weeks    Status New    Target Date 08/06/21      PT LONG TERM GOAL #2   Title Pt will increase strength of L shoulder flexion and abduction without pain by at least 1/2 MMT grade in order to demonstrate improvement in strength and function.    Baseline 06/11/21: L shoulder flexion: 4/5, L shoulder abduction: 4+/5;     Time 8    Period Weeks    Status New    Target Date 08/06/21      PT LONG TERM GOAL #3   Title Pt will improve FOTO score to at least 71 in order to demonstrate significant improvement in function related to her LUE pain    Baseline 06/11/21: 59    Time 8    Period Weeks    Status New    Target Date 08/06/21                    Plan - 06/13/21 1549     Clinical Impression Statement Pt is a pleasant 55 year-old female referred for cervical spondylosis, scapholunate dissociation of left wrist, and carpal tunnel syndrome of left wrist. She does appear to have symptoms of carpal tunnel in her L hand however arrives today complaining of more generalized pain in entire LUE with numbness to light touch sensation testing along dermatomes C5-T1. She complains particularly of numbness in digits 4 and 5 of L hand. Pt complains of pain in her chest near pec minor with reproduction of her LUE symptoms during palpation. Spurlings A/B negative and she has full and painless AROM with overpressure of cervical spine, L shoulder, and L elbow. However she does have weakness in L shoulder flexion and abduction as well as L elbow flexion and extension compared to the R side. Her L grip strength is diminished significantly averaging 16.9# compared to 34.4# on the R side. She reports reproduction of her LUE symptoms with L unilateral passive accessory mobility testing of C4-6. Absent L bicep jerk reflex (L brachioradialis is intact). Concern present for nerve compression either in the neck or more distal along the brachial plexus. Initiated  HEP today and pt will return for additional testing at next visit. Pt presents with deficits in strength, sensation, and pain. She will benefit from skilled PT services to address deficits and return to pain-free function at home and work.    Personal Factors and Comorbidities Comorbidity 2    Comorbidities cervical spondylosis, R carpal tunnel syndrome     Examination-Activity Limitations Carry;Lift    Examination-Participation Restrictions Cleaning;Community Activity;Occupation    Stability/Clinical Decision Making Evolving/Moderate complexity    Clinical Decision Making Moderate    Rehab Potential Good    PT Frequency 2x / week    PT Duration 8 weeks    PT Treatment/Interventions ADLs/Self Care Home Management;Aquatic Therapy;Canalith Repostioning;Cryotherapy;Electrical Stimulation;Iontophoresis 4mg /ml Dexamethasone;Moist Heat;Traction;Ultrasound;Gait training;Functional mobility training;Therapeutic activities;Therapeutic exercise;Balance training;Neuromuscular re-education;Manual techniques;Dry needling;Vestibular;Spinal Manipulations;Joint Manipulations;Passive range of motion    PT Next Visit Plan Complete QuickDASH, special tests of left shoulder, measure left wrist range of motion, initiate distal median nerve glides at wrist for carpal tunnel, review HEP, manual techniques for median nerve tension, consider trigger point dry needling to pectoralis major/minor as well as scalene stretches and first rib mobilizations    PT Home Exercise Plan Access Code: [email or web address]    Consulted and Agree with Plan of Care Patient             Patient will benefit from skilled therapeutic intervention in order to improve the following deficits and impairments:  Pain, Decreased strength  Visit Diagnosis: Pain in left arm  Pain in left hand  Muscle weakness (generalized)     Problem List Patient Active Problem List   Diagnosis Date Noted   Cervical spondylosis 06/02/2021   Carpal tunnel syndrome of left wrist 06/02/2021   Scapholunate dissociation of left wrist 06/02/2021   Pain of left sacroiliac joint 05/19/2021   Cervical paraspinal muscle spasm 05/19/2021   Chronic idiopathic constipation 11/17/2017   Ovarian mass 03/11/2017   Lyndel Safe Renette Hsu PT, DPT, GCS  Brendalee Matthies 06/13/2021, 4:17 PM  [facility]  [facility] [address] [place], [place], [address] Phone: [phone]   Fax:  [phone]  Name: Rashanna Christiana MRN: [id number] Date of Birth: [birth year]

## 2021-06-17 ENCOUNTER — Ambulatory Visit: Payer: BC Managed Care – PPO

## 2021-06-17 ENCOUNTER — Other Ambulatory Visit: Payer: Self-pay

## 2021-06-17 DIAGNOSIS — M79602 Pain in left arm: Secondary | ICD-10-CM | POA: Diagnosis not present

## 2021-06-17 DIAGNOSIS — M6281 Muscle weakness (generalized): Secondary | ICD-10-CM

## 2021-06-17 NOTE — Patient Instructions (Signed)
Access Code: [email or web address] URL: [email or web address] Date: 06/17/2021 Prepared by: Roxana Hires  Exercises Doorway Pec Stretch at 60 Degrees Abduction with Arm Straight (Mirrored) - 2 x daily - 7 x weekly - 3 reps - 30s hold Doorway Pec Stretch at 120 Elevation with Arm Straight (Mirrored) - 2 x daily - 7 x weekly - 3 reps - 30s hold Median Nerve Flossing - Tray - 2 x daily - 7 x weekly - 2 sets - 10 reps - 5s hold Isometric Shoulder Flexion at Wall - 1 x daily - 7 x weekly - 2 sets - 10 reps - 5s hold Isometric Shoulder External Rotation at Wall - 1 x daily - 7 x weekly - 2 sets - 10 reps - 5s hold Isometric Shoulder Abduction at Wall - 1 x daily - 7 x weekly - 2 sets - 10 reps - 5s hold

## 2021-06-17 NOTE — Therapy (Signed)
[facility] [address] [place], [place], [address] Phone: [phone]   Fax:  [phone]  Physical Therapy Treatment  Patient Details  Name: Charlene Keith MRN: [id number] Date of Birth: May 30, 1966 Referring Provider (PT): Dr. Zigmund Daniel   Encounter Date: 06/17/2021   PT End of Session - 06/17/21 1402     Visit Number 2    Number of Visits 17    Date for PT Re-Evaluation 08/06/21    Authorization Type eval; 06/11/21    PT Start Time 1402    PT Stop Time 1445    PT Time Calculation (min) 43 min    Activity Tolerance Patient tolerated treatment well    Behavior During Therapy [facility] for tasks assessed/performed             Past Medical History:  Diagnosis Date   Ovarian cyst     Past Surgical History:  Procedure Laterality Date   ABDOMINAL HYSTERECTOMY     NOSE SURGERY     WISDOM TOOTH EXTRACTION Bilateral     There were no vitals filed for this visit.   Subjective Assessment - 06/17/21 1401     Subjective Pt reports her L shoulder is feeling much better since the initial evaluation. She has been performing her stretches at home which have been helpful. She denies any resting L shoulder pain upon arrival.    Pertinent History Pt states that she moved into a new house the first week in June. The week prior she was packing up her belongings when she started to experience LUE pain. She has continued to experience L arm pain which is particularly aggravated when lifting with her left upper extremity.  She currently complains of left upper extremity weakness as well.  Pain has improved slightly since it started but is still intermittently painful. She also notes that the pain worsens when it is cold, when she is driving, and when she lays on her L side. Symptoms improve with TENS, medication (Tylenol and Mobic), stretching her L shoulder, ice, massage, and keeping her arm covered with warm clothing. She complains of numbness and  tingling in digits 4-5 on her L hand. Approximately 1 month ago she was bit by a spider on her L hand causing pain and swelling. She was prescribed steroids and symptoms have since resolved.  She saw Dr. Rosette Reveal who diagnosed her with cervical spondylosis (chronic) as well as carpal tunnel syndrome of her left wrist.  Per medical notes recent radiographs reveal scapholunate space widening which may be a confounding element in her wrist mechanics and resultant median nerve involvement.  She was advised to utilize night splinting and wean from wakeful hour brace usage as she progresses with PT. Recent cervical radiographs revealed focal intervertebral involvement at C5-6.  She has seen a chiropractor for her neck pain with the most recent visit being May 16, 2021.  She describes receiving what sounds like cervical traction but it did not have any effect on her left upper extremity pain.  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    Limitations Lifting    Diagnostic tests See history    Patient Stated Goals Decreased LUE pain and improve strength               TREATMENT   Manual Therapy  Pt completed quickDASH: 50% impaired;  SPECIAL TESTS  Rotator Cuff  Drop Arm Test: Negative Painful Arc (Pain from 60 to 120 degrees scaption): Negative Infraspinatus Muscle Test: Negative  Subacromial Impingement Hawkins-Kennedy: Negative Neer (Block scapula, PROM flexion): Negative Painful Arc (Pain from 60 to 120 degrees scaption): Negative Empty Can: Positive External Rotation Resistance: Negative Horizontal Adduction: Positive Scapular Assist: Not examined  Labral Tear Biceps Load II (120 elevation, full ER, 90 elbow flexion, full supination, resisted elbow flexion): Negative Crank (160 scaption, axial load with IR/ER): Negative Active Compression Test: Positive  Bicep Tendon  Pathology Speed (shoulder flexion to 90, external rotation, full elbow extension, and forearm supination with resistance: Positive Yergason's (resisted shoulder ER and supination/biceps tendon pathology): Negative  Shoulder Instability Sulcus Sign: Negative Anterior Apprehension: Negative  AROM: R/L Wrist extension: 64/54  Wrist flexion: 70/50 Ulnar deviation: 52/48 Radial deviation: 34/28  L shoulder median nerve glides x 10; L scalene and L upper trap stretch x 30s each; L shoulder AP mobilizations, grade III, shoulder at  neutral, 30s/bout x 2 bouts; L shoulder inferior mobilziations, grade III, shoulder at 90 abduction, 30s/bout x 1 bout; L shoulder AP mobilizations, grade III, shoulder at 90 abduction and available end range ER 30s/bout x 2 bouts; L shoulder posterior and inferior mobilizations, grade III, shoulder at available end range flexion, 30s/bout x 2 bouts; STM to L pec minor/major while also adding passive L shoulder horizontal adduction as well as flexion and IR/ER;   Ther-ex  Left shoulder isometric abduction, external rotation, and flexion, 5-second hold x5 each;   Trigger Point Dry Needling (TDN), unbilled Education performed with patient regarding potential benefit of TDN. Reviewed precautions and risks with patient. Reviewed special precautions/risks over lung fields which include pneumothorax. Reviewed signs and symptoms of pneumothorax and advised pt to go to ER immediately if these symptoms develop advise them of dry needling treatment. Extensive time spent with pt to ensure full understanding of TDN risks. Pt provided verbal consent to treatment. TDN performed to L upper trap with 1, 0.25 x 60 single needle placement with local twitch response (LTR). Pistoning technique utilized. Pt does not like the dry needling and reports it is uncomfortable. She defers additional placements today.    Patient completed QuickDASH questionnaire which shows 50% disability  related to her left shoulder pain.  Measurements of left wrist range of motion obtained today and she demonstrates decrease in left wrist range of motion in flexion, extension, radial deviation, and ulnar deviation. Additional special testing performed to left shoulder which reveals positive empty can, positive horizontal adduction, and positive Speed test.  Difficult to interpret results special test due to generalized pain around entire left shoulder girdle.  Performed median nerve glides and initiated passive accessory mobilizations and soft tissue mobilization to left shoulder girdle during session today.  She reports improvement in left shoulder mobility with less pain at the end of session.  Attempted trigger point dry needling with one placement to left upper trap however patient reports discomfort during dry needling and defers additional placements today.  Added isometric strengthening to patient's home program and encouraged her to continue stretches at home. Pt will benefit from PT services to address deficits in strength, mobility, and pain in order to return to full function at home.                           PT Short Term Goals - 06/13/21 1551       PT SHORT TERM GOAL #1  Title Pt will be independent with HEP in order to improve strength and decrease L arm/hand in order to improve pain-free function at home and work.    Time 4    Period Weeks    Status New    Target Date 07/09/21               PT Long Term Goals - 06/17/21 1610       PT LONG TERM GOAL #1   Title Pt will decrease worst L arm/hand pain as reported on NPRS by at least 3 points in order to demonstrate clinically significant reduction in arm/hand pain.    Baseline 06/11/21: worst: 10/10    Time 8    Period Weeks    Status New    Target Date 08/06/21      PT LONG TERM GOAL #2   Title Pt will increase strength of L shoulder flexion and abduction without pain by at least 1/2 MMT grade in  order to demonstrate improvement in strength and function.    Baseline 06/11/21: L shoulder flexion: 4/5, L shoulder abduction: 4+/5;    Time 8    Period Weeks    Status New    Target Date 08/06/21      PT LONG TERM GOAL #3   Title Pt will improve FOTO score to at least 71 in order to demonstrate significant improvement in function related to her LUE pain    Baseline 06/11/21: 59    Time 8    Period Weeks    Status New    Target Date 08/06/21      PT LONG TERM GOAL #4   Title Pt will decrease quick DASH score by at least 8% in order to demonstrate clinically significant reduction in disability.    Baseline 06/17/21: 50%    Time 8    Period Weeks    Status New    Target Date 08/06/21                   Plan - 06/17/21 1402     Clinical Impression Statement Patient completed QuickDASH questionnaire which shows 50% disability related to her left shoulder pain.  Measurements of left wrist range of motion obtained today and she demonstrates decrease in left wrist range of motion in flexion, extension, radial deviation, and ulnar deviation. Additional special testing performed to left shoulder which reveals positive empty can, positive horizontal adduction, and positive Speed test.  Difficult to interpret results special test due to generalized pain around entire left shoulder girdle.  Performed median nerve glides and initiated passive accessory mobilizations and soft tissue mobilization to left shoulder girdle during session today.  She reports improvement in left shoulder mobility with less pain at the end of session.  Attempted trigger point dry needling with one placement to left upper trap however patient reports discomfort during dry needling and defers additional placements today.  Added isometric strengthening to patient's home program and encouraged her to continue stretches at home. Pt will benefit from PT services to address deficits in strength, mobility, and pain in order to  return to full function at home.    Personal Factors and Comorbidities Comorbidity 2    Comorbidities cervical spondylosis, R carpal tunnel syndrome    Examination-Activity Limitations Carry;Lift    Examination-Participation Restrictions Cleaning;Community Activity;Occupation    Stability/Clinical Decision Making Evolving/Moderate complexity    Rehab Potential Good    PT Frequency 2x / week    PT Duration 8  weeks    PT Treatment/Interventions ADLs/Self Care Home Management;Aquatic Therapy;Canalith Repostioning;Cryotherapy;Electrical Stimulation;Iontophoresis 4mg /ml Dexamethasone;Moist Heat;Traction;Ultrasound;Gait training;Functional mobility training;Therapeutic activities;Therapeutic exercise;Balance training;Neuromuscular re-education;Manual techniques;Dry needling;Vestibular;Spinal Manipulations;Joint Manipulations;Passive range of motion    PT Next Visit Plan Review HEP, continue manual techniques and strengthening    PT Home Exercise Plan Access Code: [email or web address]    Consulted and Agree with Plan of Care Patient             Patient will benefit from skilled therapeutic intervention in order to improve the following deficits and impairments:  Pain, Decreased strength  Visit Diagnosis: Pain in left arm  Muscle weakness (generalized)     Problem List Patient Active Problem List   Diagnosis Date Noted   Cervical spondylosis 06/02/2021   Carpal tunnel syndrome of left wrist 06/02/2021   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 [place], [place], [address] Phone: [phone]   Fax:  [phone]  Name: Charlene Keith MRN: [id number] Date of Birth: 1966-01-08

## 2021-06-19 ENCOUNTER — Ambulatory Visit: Payer: BC Managed Care – PPO

## 2021-06-19 ENCOUNTER — Other Ambulatory Visit: Payer: Self-pay

## 2021-06-19 DIAGNOSIS — M79602 Pain in left arm: Secondary | ICD-10-CM

## 2021-06-19 DIAGNOSIS — M6281 Muscle weakness (generalized): Secondary | ICD-10-CM

## 2021-06-19 NOTE — Therapy (Signed)
[facility] [address] [place], [place], [address] Phone: [phone]   Fax:  [phone]  Physical Therapy Treatment  Patient Details  Name: Charlene Keith MRN: [id number] Date of Birth: [birth year] Referring Provider (PT): Dr. Zigmund Daniel   Encounter Date: 06/19/2021   PT End of Session - 06/19/21 1455     Visit Number 3    Number of Visits 17    Date for PT Re-Evaluation 08/06/21    Authorization Type eval; 06/11/21    PT Start Time 1405    PT Stop Time 1445    PT Time Calculation (min) 40 min    Activity Tolerance Patient tolerated treatment well    Behavior During Therapy [facility] for tasks assessed/performed             Past Medical History:  Diagnosis Date   Ovarian cyst     Past Surgical History:  Procedure Laterality Date   ABDOMINAL HYSTERECTOMY     NOSE SURGERY     WISDOM TOOTH EXTRACTION Bilateral     There were no vitals filed for this visit.   Subjective Assessment - 06/19/21 1454     Subjective Pt reports her L shoulder continues to improve.  She has been performing her stretches at home which have been helpful. She denies any resting L shoulder pain upon arrival.  She would like to know if there are any exercise she can do on the [employer] at home at home to help with her shoulder.    Pertinent History Pt states that she moved into a new house the first week in June. The week prior she was packing up her belongings when she started to experience LUE pain. She has continued to experience L arm pain which is particularly aggravated when lifting with her left upper extremity.  She currently complains of left upper extremity weakness as well.  Pain has improved slightly since it started but is still intermittently painful. She also notes that the pain worsens when it is cold, when she is driving, and when she lays on her L side. Symptoms improve with TENS, medication (Tylenol and Mobic), stretching her L shoulder,  ice, massage, and keeping her arm covered with warm clothing. She complains of numbness and tingling in digits 4-5 on her L hand. Approximately 1 month ago she was bit by a spider on her L hand causing pain and swelling. She was prescribed steroids and symptoms have since resolved.  She saw Dr. Rosette Reveal who diagnosed her with cervical spondylosis (chronic) as well as carpal tunnel syndrome of her left wrist.  Per medical notes recent radiographs reveal scapholunate space widening which may be a confounding element in her wrist mechanics and resultant median nerve involvement.  She was advised to utilize night splinting and wean from wakeful hour brace usage as she progresses with PT. Recent cervical radiographs revealed focal intervertebral involvement at C5-6.  She has seen a chiropractor for her neck pain with the most recent visit being May 16, 2021.  She describes receiving what sounds like cervical traction but it did not have any effect on her left upper extremity pain.  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    Limitations Lifting    Diagnostic tests See history    Patient Stated Goals Decreased LUE pain and improve strength  TREATMENT   Manual Therapy  L shoulder median nerve glides x 10; L pectoralis minor/major stretches into horizontal abduction 2 x 30 seconds, and overhead 2 x 30 seconds; L scalene and L upper trap stretch 2 x 30s each; L shoulder AP mobilizations, grade III, shoulder at  neutral, 30s/bout x 2 bouts; L shoulder inferior mobilziations, grade III, shoulder at 90 abduction, 30s/bout x 1 bout; L shoulder AP mobilizations, grade III, shoulder at 90 abduction and available end range ER 30s/bout x 2 bouts; L shoulder posterior and inferior mobilizations, grade III, shoulder at available end range flexion, 30s/bout x 2 bouts; Left first rib inferior  mobilizations, grade 3, 30 seconds per bout x2 bouts; Supine T3-5 thoracic opening manipulation; STM to L pec minor/major while also adding passive L shoulder horizontal adduction as well as flexion and IR/ER;   Ther-ex  [employer] low rows x10; [employer] high rows x10; [employer] supine tricep press downs x10; [employer] supine shoulder extension from 90 flexion down to neutral x10;   Pt educated throughout session about proper posture and technique with exercises. Improved exercise technique, movement at target joints, use of target muscles after min to mod verbal, visual, tactile cues.    Patient demonstrates excellent motivation during session today.  Continued with passive accessory mobilizations to left glenohumeral joint and left first rib.  Also continued with left scalene and left upper trap stretches which reproduce patient's radicular symptoms down left upper extremity.  Repeated soft tissue mobilization to left pectoralis minor/major wells median nerve glides.  Patient educated on exercise she can perform the [employer] at home for periscapular strengthening. Pt will benefit from PT services to address deficits in strength, mobility, and pain in order to return to full function at home.                           PT Short Term Goals - 06/13/21 1551       PT SHORT TERM GOAL #1   Title Pt will be independent with HEP in order to improve strength and decrease L arm/hand in order to improve pain-free function at home and work.    Time 4    Period Weeks    Status New    Target Date 07/09/21               PT Long Term Goals - 06/17/21 1610       PT LONG TERM GOAL #1   Title Pt will decrease worst L arm/hand pain as reported on NPRS by at least 3 points in order to demonstrate clinically significant reduction in arm/hand pain.    Baseline 06/11/21: worst: 10/10    Time 8    Period Weeks    Status New    Target Date 08/06/21      PT LONG TERM GOAL #2    Title Pt will increase strength of L shoulder flexion and abduction without pain by at least 1/2 MMT grade in order to demonstrate improvement in strength and function.    Baseline 06/11/21: L shoulder flexion: 4/5, L shoulder abduction: 4+/5;    Time 8    Period Weeks    Status New    Target Date 08/06/21      PT LONG TERM GOAL #3   Title Pt will improve FOTO score to at least 71 in order to demonstrate significant improvement in function related to her LUE pain  Baseline 06/11/21: 59    Time 8    Period Weeks    Status New    Target Date 08/06/21      PT LONG TERM GOAL #4   Title Pt will decrease quick DASH score by at least 8% in order to demonstrate clinically significant reduction in disability.    Baseline 06/17/21: 50%    Time 8    Period Weeks    Status New    Target Date 08/06/21                   Plan - 06/19/21 1455     Clinical Impression Statement Patient demonstrates excellent motivation during session today.  Continued with passive accessory mobilizations to left glenohumeral joint and left first rib.  Also continued with left scalene and left upper trap stretches which reproduce patient's radicular symptoms down left upper extremity.  Repeated soft tissue mobilization to left pectoralis minor/major wells median nerve glides.  Patient educated on exercise she can perform the [employer] at home for periscapular strengthening. Pt will benefit from PT services to address deficits in strength, mobility, and pain in order to return to full function at home.    Personal Factors and Comorbidities Comorbidity 2    Comorbidities cervical spondylosis, R carpal tunnel syndrome    Examination-Activity Limitations Carry;Lift    Examination-Participation Restrictions Cleaning;Community Activity;Occupation    Stability/Clinical Decision Making Evolving/Moderate complexity    Rehab Potential Good    PT Frequency 2x / week    PT Duration 8 weeks    PT Treatment/Interventions  ADLs/Self Care Home Management;Aquatic Therapy;Canalith Repostioning;Cryotherapy;Electrical Stimulation;Iontophoresis 4mg /ml Dexamethasone;Moist Heat;Traction;Ultrasound;Gait training;Functional mobility training;Therapeutic activities;Therapeutic exercise;Balance training;Neuromuscular re-education;Manual techniques;Dry needling;Vestibular;Spinal Manipulations;Joint Manipulations;Passive range of motion    PT Next Visit Plan Review HEP, continue manual techniques and strengthening    PT Home Exercise Plan Access Code: [email or web address]    Consulted and Agree with Plan of Care Patient             Patient will benefit from skilled therapeutic intervention in order to improve the following deficits and impairments:  Pain, Decreased strength  Visit Diagnosis: Pain in left arm  Muscle weakness (generalized)     Problem List Patient Active Problem List   Diagnosis Date Noted   Cervical spondylosis 06/02/2021   Carpal tunnel syndrome of left wrist 06/02/2021   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 [place], [place], [address] Phone: [phone]   Fax:  [phone]  Name: Charlene Keith MRN: [id number] Date of Birth: Aug 09, 1966

## 2021-06-23 ENCOUNTER — Other Ambulatory Visit: Payer: Self-pay

## 2021-06-23 ENCOUNTER — Ambulatory Visit: Payer: BC Managed Care – PPO

## 2021-06-23 DIAGNOSIS — M6281 Muscle weakness (generalized): Secondary | ICD-10-CM

## 2021-06-23 DIAGNOSIS — M79602 Pain in left arm: Secondary | ICD-10-CM | POA: Diagnosis not present

## 2021-06-23 NOTE — Therapy (Signed)
[facility] [address]. [place], [place], [address] Phone: [phone]   Fax:  [phone]  Physical Therapy Treatment  Patient Details  Name: Charlene Keith MRN: [id number] Date of Birth: August 08, 1966 Referring Provider (PT): Dr. Zigmund Daniel   Encounter Date: 06/23/2021   PT End of Session - 06/23/21 1151     Visit Number 4    Number of Visits 17    Date for PT Re-Evaluation 08/06/21    Authorization Type eval; 06/11/21    PT Start Time 1148    PT Stop Time 1230    PT Time Calculation (min) 42 min    Activity Tolerance Patient tolerated treatment well    Behavior During Therapy [facility] for tasks assessed/performed             Past Medical History:  Diagnosis Date   Ovarian cyst     Past Surgical History:  Procedure Laterality Date   ABDOMINAL HYSTERECTOMY     NOSE SURGERY     WISDOM TOOTH EXTRACTION Bilateral     There were no vitals filed for this visit.   Subjective Assessment - 06/23/21 1150     Subjective Pt reports her L shoulder continues to improve.  She has been performing her stretches at home which have been helpful and states that if she performs them every hour it helps her symptoms. She reports 2/10 resting L shoulder pain upon arrival.    Pertinent History Pt states that she moved into a new house the first week in June. The week prior she was packing up her belongings when she started to experience LUE pain. She has continued to experience L arm pain which is particularly aggravated when lifting with her left upper extremity.  She currently complains of left upper extremity weakness as well.  Pain has improved slightly since it started but is still intermittently painful. She also notes that the pain worsens when it is cold, when she is driving, and when she lays on her L side. Symptoms improve with TENS, medication (Tylenol and Mobic), stretching her L shoulder, ice, massage, and keeping her arm covered with warm  clothing. She complains of numbness and tingling in digits 4-5 on her L hand. Approximately 1 month ago she was bit by a spider on her L hand causing pain and swelling. She was prescribed steroids and symptoms have since resolved.  She saw Dr. Rosette Reveal who diagnosed her with cervical spondylosis (chronic) as well as carpal tunnel syndrome of her left wrist.  Per medical notes recent radiographs reveal scapholunate space widening which may be a confounding element in her wrist mechanics and resultant median nerve involvement.  She was advised to utilize night splinting and wean from wakeful hour brace usage as she progresses with PT. Recent cervical radiographs revealed focal intervertebral involvement at C5-6.  She has seen a chiropractor for her neck pain with the most recent visit being May 16, 2021.  She describes receiving what sounds like cervical traction but it did not have any effect on her left upper extremity pain.  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    Limitations Lifting    Diagnostic tests See history    Patient Stated Goals Decreased LUE pain and improve strength               TREATMENT   Manual Therapy  L shoulder median nerve glides x 10; L pectoralis minor/major stretches into horizontal abduction 2 x 30 seconds, and overhead 2 x 30 seconds; L scalene and L upper trap stretch 2 x 30s each; L shoulder AP mobilizations, grade III, shoulder at  neutral, 30s/bout x 2 bouts; L shoulder inferior mobilziations, grade III, shoulder at 90 abduction, 30s/bout x 1 bout; L shoulder AP mobilizations, grade III, shoulder at 90 abduction and available end range ER 30s/bout x 2 bouts; L shoulder posterior and inferior mobilizations, grade III, shoulder at available end range flexion, 30s/bout x 2 bouts; Left first rib inferior mobilizations, grade 3, 30 seconds per bout x2  bouts; STM to L pec minor/major while also adding passive L shoulder horizontal adduction as well as flexion and IR/ER;   Ther-ex  Supine left shoulder serratus punch with manual resistance from therapist x10; Supine left shoulder flexion with 2 pound dumbbell 2x10; Right side-lying left shoulder ER with 2 pound dumbbell 2x10; Supine left shoulder abduction with 2 pound dumbbell 2x10; Seated left distal median nerve glides x10; Seated rows with blue Thera-Band x10; HEP progression provided to patient;   Pt educated throughout session about proper posture and technique with exercises. Improved exercise technique, movement at target joints, use of target muscles after min to mod verbal, visual, tactile cues.    Patient demonstrates excellent motivation during session today.  Continued with passive accessory mobilizations to left glenohumeral joint and left first rib.  Also continued with left scalene and left upper trap stretches which reproduce patient's radicular symptoms down left upper extremity.  Repeated soft tissue mobilization to left pectoralis minor/major wells median nerve glides.  Initiated additional strengthening today including serratus punches and resisted left shoulder flexion, abduction and ER.  Patient educated about how to perform distal median nerve glides for left carpal tunnel.  HEP progression issued at end of session. Pt will benefit from PT services to address deficits in strength, mobility, and pain in order to return to full function at home.                           PT Short Term Goals - 06/13/21 1551       PT SHORT TERM GOAL #1   Title Pt will be independent with HEP in order to improve strength and decrease L arm/hand in order to improve pain-free function at home and work.    Time 4    Period Weeks    Status New    Target Date 07/09/21               PT Long Term Goals - 06/17/21 1610       PT LONG TERM GOAL #1   Title Pt  will decrease worst L arm/hand pain as reported on NPRS by at least 3 points in order to demonstrate clinically significant reduction in arm/hand pain.    Baseline 06/11/21: worst: 10/10    Time 8    Period Weeks    Status New    Target Date 08/06/21      PT LONG TERM GOAL #2   Title Pt will increase strength of L shoulder flexion and abduction without pain by at least 1/2 MMT grade in order to demonstrate improvement in strength and function.    Baseline 06/11/21: L shoulder flexion: 4/5, L shoulder abduction: 4+/5;    Time 8    Period Weeks    Status New    Target  Date 08/06/21      PT LONG TERM GOAL #3   Title Pt will improve FOTO score to at least 71 in order to demonstrate significant improvement in function related to her LUE pain    Baseline 06/11/21: 59    Time 8    Period Weeks    Status New    Target Date 08/06/21      PT LONG TERM GOAL #4   Title Pt will decrease quick DASH score by at least 8% in order to demonstrate clinically significant reduction in disability.    Baseline 06/17/21: 50%    Time 8    Period Weeks    Status New    Target Date 08/06/21                   Plan - 06/23/21 1151     Clinical Impression Statement Patient demonstrates excellent motivation during session today.  Continued with passive accessory mobilizations to left glenohumeral joint and left first rib.  Also continued with left scalene and left upper trap stretches which reproduce patient's radicular symptoms down left upper extremity.  Repeated soft tissue mobilization to left pectoralis minor/major wells median nerve glides.  Initiated additional strengthening today including serratus punches and resisted left shoulder flexion, abduction and ER.  Patient educated about how to perform distal median nerve glides for left carpal tunnel.  HEP progression issued at end of session. Pt will benefit from PT services to address deficits in strength, mobility, and pain in order to return to full  function at home.    Personal Factors and Comorbidities Comorbidity 2    Comorbidities cervical spondylosis, R carpal tunnel syndrome    Examination-Activity Limitations Carry;Lift    Examination-Participation Restrictions Cleaning;Community Activity;Occupation    Stability/Clinical Decision Making Evolving/Moderate complexity    Rehab Potential Good    PT Frequency 2x / week    PT Duration 8 weeks    PT Treatment/Interventions ADLs/Self Care Home Management;Aquatic Therapy;Canalith Repostioning;Cryotherapy;Electrical Stimulation;Iontophoresis 4mg /ml Dexamethasone;Moist Heat;Traction;Ultrasound;Gait training;Functional mobility training;Therapeutic activities;Therapeutic exercise;Balance training;Neuromuscular re-education;Manual techniques;Dry needling;Vestibular;Spinal Manipulations;Joint Manipulations;Passive range of motion    PT Next Visit Plan Review HEP, continue manual techniques and strengthening    PT Home Exercise Plan Access Code: [email or web address]    Consulted and Agree with Plan of Care Patient             Patient will benefit from skilled therapeutic intervention in order to improve the following deficits and impairments:  Pain, Decreased strength  Visit Diagnosis: Pain in left arm  Muscle weakness (generalized)     Problem List Patient Active Problem List   Diagnosis Date Noted   Cervical spondylosis 06/02/2021   Carpal tunnel syndrome of left wrist 06/02/2021   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 [place], [place], [address] Phone: [phone]   Fax:  [phone]  Name: Jennett Tarbell MRN: [id number] Date of Birth: 03/16/1966

## 2021-06-26 ENCOUNTER — Ambulatory Visit: Payer: BC Managed Care – PPO

## 2021-06-26 ENCOUNTER — Other Ambulatory Visit: Payer: Self-pay

## 2021-06-26 DIAGNOSIS — M79602 Pain in left arm: Secondary | ICD-10-CM

## 2021-06-26 DIAGNOSIS — M6281 Muscle weakness (generalized): Secondary | ICD-10-CM

## 2021-06-26 NOTE — Therapy (Signed)
[facility] [address] [place], [place], [address] Phone: [phone]   Fax:  [phone]  Physical Therapy Treatment  Patient Details  Name: Charlene Keith MRN: [id number] Date of Birth: 1966-03-18 Referring Provider (PT): Dr. Zigmund Daniel   Encounter Date: 06/26/2021   PT End of Session - 06/26/21 0936     Visit Number 5    Number of Visits 17    Date for PT Re-Evaluation 08/06/21    Authorization Type eval; 06/11/21    PT Start Time 0930    PT Stop Time 1015    PT Time Calculation (min) 45 min    Activity Tolerance Patient tolerated treatment well    Behavior During Therapy [facility] for tasks assessed/performed             Past Medical History:  Diagnosis Date   Ovarian cyst     Past Surgical History:  Procedure Laterality Date   ABDOMINAL HYSTERECTOMY     NOSE SURGERY     WISDOM TOOTH EXTRACTION Bilateral     There were no vitals filed for this visit.   Subjective Assessment - 06/26/21 0932     Subjective Pt reports her L shoulder continues to improve. She has been performing her stretches at home which have been helpful.  Numbness in her left hand has been decreasing.  She reports 1-2/10 resting L shoulder pain upon arrival.    Pertinent History Pt states that she moved into a new house the first week in June. The week prior she was packing up her belongings when she started to experience LUE pain. She has continued to experience L arm pain which is particularly aggravated when lifting with her left upper extremity.  She currently complains of left upper extremity weakness as well.  Pain has improved slightly since it started but is still intermittently painful. She also notes that the pain worsens when it is cold, when she is driving, and when she lays on her L side. Symptoms improve with TENS, medication (Tylenol and Mobic), stretching her L shoulder, ice, massage, and keeping her arm covered with warm clothing. She  complains of numbness and tingling in digits 4-5 on her L hand. Approximately 1 month ago she was bit by a spider on her L hand causing pain and swelling. She was prescribed steroids and symptoms have since resolved.  She saw Dr. Rosette Reveal who diagnosed her with cervical spondylosis (chronic) as well as carpal tunnel syndrome of her left wrist.  Per medical notes recent radiographs reveal scapholunate space widening which may be a confounding element in her wrist mechanics and resultant median nerve involvement.  She was advised to utilize night splinting and wean from wakeful hour brace usage as she progresses with PT. Recent cervical radiographs revealed focal intervertebral involvement at C5-6.  She has seen a chiropractor for her neck pain with the most recent visit being May 16, 2021.  She describes receiving what sounds like cervical traction but it did not have any effect on her left upper extremity pain.  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    Limitations Lifting    Diagnostic tests See history    Patient Stated Goals Decreased LUE pain and improve strength               TREATMENT   Manual Therapy  L shoulder median  nerve glides x 10; L pectoralis minor/major stretches into horizontal abduction 2 x 30 seconds, and overhead 2 x 30 seconds; L scalene and L upper trap stretch 2 x 30s each; L shoulder AP mobilizations, grade III, shoulder at  neutral, 30s/bout x 2 bouts; L shoulder inferior mobilziations, grade III, shoulder at 90 abduction, 30s/bout x 1 bout; L shoulder AP mobilizations, grade III, shoulder at 90 abduction and available end range ER 30s/bout x 2 bouts; Left first rib inferior mobilizations, grade 3, 30 seconds per bout x2 bouts; STM to L pec minor/major while also adding passive L shoulder horizontal adduction as well as flexion and IR/ER; Supine upper  thoracic opening thrust manipulation with cavitation at T4;   Ther-ex  Supine left shoulder serratus punch with manual resistance from therapist x10; Prone shoulder extension with 2# dumbbell (DB) x 10; Prone shoulder low rows with 2# DB x 10; Prone shoulder mid trap horizontal abduction with 2# DB x 10; Prone shoulder low trap forward flexion with 2# DB x 10;   Pt educated throughout session about proper posture and technique with exercises. Improved exercise technique, movement at target joints, use of target muscles after min to mod verbal, visual, tactile cues.    Patient demonstrates excellent motivation during session today.  Continued with passive accessory mobilizations to left glenohumeral joint and left first rib.  Also continued with left scalene and left upper trap stretches which reproduce patient's radicular symptoms down left upper extremity.  Repeated soft tissue mobilization to left pectoralis minor/major wells median nerve glides.  She was prone strengthening in a program today which patient reports relieves her left upper extremity symptoms.  No HEP progression provided on this date.  Patient encouraged to wear night splint on left wrist to decrease carpal tunnel compression.  Pt will benefit from PT services to address deficits in strength and pain in order to return to full function at home and work.                          PT Short Term Goals - 06/13/21 1551       PT SHORT TERM GOAL #1   Title Pt will be independent with HEP in order to improve strength and decrease L arm/hand in order to improve pain-free function at home and work.    Time 4    Period Weeks    Status New    Target Date 07/09/21               PT Long Term Goals - 06/17/21 1610       PT LONG TERM GOAL #1   Title Pt will decrease worst L arm/hand pain as reported on NPRS by at least 3 points in order to demonstrate clinically significant reduction in arm/hand pain.     Baseline 06/11/21: worst: 10/10    Time 8    Period Weeks    Status New    Target Date 08/06/21      PT LONG TERM GOAL #2   Title Pt will increase strength of L shoulder flexion and abduction without pain by at least 1/2 MMT grade in order to demonstrate improvement in strength and function.    Baseline 06/11/21: L shoulder flexion: 4/5, L shoulder abduction: 4+/5;    Time 8    Period Weeks    Status New    Target Date 08/06/21      PT LONG TERM GOAL #3  Title Pt will improve FOTO score to at least 71 in order to demonstrate significant improvement in function related to her LUE pain    Baseline 06/11/21: 59    Time 8    Period Weeks    Status New    Target Date 08/06/21      PT LONG TERM GOAL #4   Title Pt will decrease quick DASH score by at least 8% in order to demonstrate clinically significant reduction in disability.    Baseline 06/17/21: 50%    Time 8    Period Weeks    Status New    Target Date 08/06/21                   Plan - 06/26/21 3664     Clinical Impression Statement Patient demonstrates excellent motivation during session today.  Continued with passive accessory mobilizations to left glenohumeral joint and left first rib.  Also continued with left scalene and left upper trap stretches which reproduce patient's radicular symptoms down left upper extremity.  Repeated soft tissue mobilization to left pectoralis minor/major wells median nerve glides.  She was prone strengthening in a program today which patient reports relieves her left upper extremity symptoms.  No HEP progression provided on this date.  Patient encouraged to wear night splint on left wrist to decrease carpal tunnel compression.  Pt will benefit from PT services to address deficits in strength and pain in order to return to full function at home and work.    Personal Factors and Comorbidities Comorbidity 2    Comorbidities cervical spondylosis, R carpal tunnel syndrome    Examination-Activity  Limitations Carry;Lift    Examination-Participation Restrictions Cleaning;Community Activity;Occupation    Stability/Clinical Decision Making Evolving/Moderate complexity    Rehab Potential Good    PT Frequency 2x / week    PT Duration 8 weeks    PT Treatment/Interventions ADLs/Self Care Home Management;Aquatic Therapy;Canalith Repostioning;Cryotherapy;Electrical Stimulation;Iontophoresis 4mg /ml Dexamethasone;Moist Heat;Traction;Ultrasound;Gait training;Functional mobility training;Therapeutic activities;Therapeutic exercise;Balance training;Neuromuscular re-education;Manual techniques;Dry needling;Vestibular;Spinal Manipulations;Joint Manipulations;Passive range of motion    PT Next Visit Plan Review HEP, continue manual techniques and strengthening    PT Home Exercise Plan Access Code: [email or web address]    Consulted and Agree with Plan of Care Patient             Patient will benefit from skilled therapeutic intervention in order to improve the following deficits and impairments:  Pain, Decreased strength  Visit Diagnosis: Pain in left arm  Muscle weakness (generalized)     Problem List Patient Active Problem List   Diagnosis Date Noted   Cervical spondylosis 06/02/2021   Carpal tunnel syndrome of left wrist 06/02/2021   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 [place], [place], [address] Phone: [phone]   Fax:  [phone]  Name: Charlene Keith MRN: [id number] Date of Birth: 05-06-1966

## 2021-07-01 ENCOUNTER — Other Ambulatory Visit: Payer: Self-pay

## 2021-07-01 ENCOUNTER — Ambulatory Visit: Payer: BC Managed Care – PPO

## 2021-07-01 DIAGNOSIS — M79602 Pain in left arm: Secondary | ICD-10-CM

## 2021-07-01 DIAGNOSIS — M6281 Muscle weakness (generalized): Secondary | ICD-10-CM

## 2021-07-01 NOTE — Therapy (Signed)
[facility] [address] [place], [place], [address] Phone: [phone]   Fax:  [phone]  Physical Therapy Treatment  Patient Details  Name: Charlene Keith MRN: [id number] Date of Birth: Mar 04, 1966 Referring Provider (PT): Dr. Zigmund Daniel   Encounter Date: 07/01/2021   PT End of Session - 07/01/21 1352     Visit Number 6    Number of Visits 17    Date for PT Re-Evaluation 08/06/21    Authorization Type eval; 06/11/21    PT Start Time 1400    PT Stop Time 1445    PT Time Calculation (min) 45 min    Activity Tolerance Patient tolerated treatment well    Behavior During Therapy [facility] for tasks assessed/performed             Past Medical History:  Diagnosis Date   Ovarian cyst     Past Surgical History:  Procedure Laterality Date   ABDOMINAL HYSTERECTOMY     NOSE SURGERY     WISDOM TOOTH EXTRACTION Bilateral     There were no vitals filed for this visit.   Subjective Assessment - 07/01/21 1351     Subjective Pt reports her L shoulder is doing well.  She has been performing her stretches at home which have been helpful.  Numbness in her left hand has been decreasing but is still present in her 4th and 5th digits. She continues to have L upper chest/anterior shoulder tightness as well as pain with deep palpation. She reports 2/10 resting L shoulder pain upon arrival.    Pertinent History Pt states that she moved into a new house the first week in June. The week prior she was packing up her belongings when she started to experience LUE pain. She has continued to experience L arm pain which is particularly aggravated when lifting with her left upper extremity.  She currently complains of left upper extremity weakness as well.  Pain has improved slightly since it started but is still intermittently painful. She also notes that the pain worsens when it is cold, when she is driving, and when she lays on her L side. Symptoms improve  with TENS, medication (Tylenol and Mobic), stretching her L shoulder, ice, massage, and keeping her arm covered with warm clothing. She complains of numbness and tingling in digits 4-5 on her L hand. Approximately 1 month ago she was bit by a spider on her L hand causing pain and swelling. She was prescribed steroids and symptoms have since resolved.  She saw Dr. Rosette Reveal who diagnosed her with cervical spondylosis (chronic) as well as carpal tunnel syndrome of her left wrist.  Per medical notes recent radiographs reveal scapholunate space widening which may be a confounding element in her wrist mechanics and resultant median nerve involvement.  She was advised to utilize night splinting and wean from wakeful hour brace usage as she progresses with PT. Recent cervical radiographs revealed focal intervertebral involvement at C5-6.  She has seen a chiropractor for her neck pain with the most recent visit being May 16, 2021.  She describes receiving what sounds like cervical traction but it did not have any effect on her left upper extremity pain.  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    Limitations Lifting    Diagnostic tests See history    Patient Stated Goals Decreased LUE pain  and improve strength               TREATMENT   Manual Therapy  L shoulder median nerve glides x 10; L pectoralis minor/major stretches into horizontal abduction 2 x 30 seconds, and overhead 2 x 30 seconds; L scalene and L upper trap stretch 2 x 30s each; L shoulder AP mobilizations, grade III, shoulder at  neutral, 30s/bout x 2 bouts; L shoulder inferior mobilziations, grade III, shoulder at 90 abduction, 30s/bout x 1 bout; L shoulder AP mobilizations, grade III, shoulder at 90 abduction and available end range ER 30s/bout x 2 bouts; Left first rib inferior mobilizations, grade 3, 30 seconds per bout x2  bouts; STM to L pec minor/major while also adding passive L shoulder horizontal adduction as well as flexion and IR/ER;   Mechanical Traction After, and separate from manual therapy, sustained mechanical traction performed in supine, 20 degree pull angle and 15# pull, x 8 minutes, no reported change in symptoms following traction;   Ther-ex  Supine left shoulder serratus punch with manual resistance from therapist x10;    Pt educated throughout session about proper posture and technique with exercises. Improved exercise technique, movement at target joints, use of target muscles after min to mod verbal, visual, tactile cues.    Patient demonstrates excellent motivation during session today.  Continued with passive accessory mobilizations to left glenohumeral joint and left first rib.  Also continued with left scalene and left upper trap stretches. Repeated soft tissue mobilization to left pectoralis minor/major wells median nerve glides. Utilized mechanical traction during session today to assess patient response and will repeat in next session if it proves helpful. No HEP progression provided on this date. Pt will benefit from PT services to address deficits in strength and pain in order to return to full function at home and work.                          PT Short Term Goals - 06/13/21 1551       PT SHORT TERM GOAL #1   Title Pt will be independent with HEP in order to improve strength and decrease L arm/hand in order to improve pain-free function at home and work.    Time 4    Period Weeks    Status New    Target Date 07/09/21               PT Long Term Goals - 06/17/21 1610       PT LONG TERM GOAL #1   Title Pt will decrease worst L arm/hand pain as reported on NPRS by at least 3 points in order to demonstrate clinically significant reduction in arm/hand pain.    Baseline 06/11/21: worst: 10/10    Time 8    Period Weeks    Status New    Target Date  08/06/21      PT LONG TERM GOAL #2   Title Pt will increase strength of L shoulder flexion and abduction without pain by at least 1/2 MMT grade in order to demonstrate improvement in strength and function.    Baseline 06/11/21: L shoulder flexion: 4/5, L shoulder abduction: 4+/5;    Time 8    Period Weeks    Status New    Target Date 08/06/21      PT LONG TERM GOAL #3   Title Pt will improve FOTO score to at least 71 in order to demonstrate significant  improvement in function related to her LUE pain    Baseline 06/11/21: 59    Time 8    Period Weeks    Status New    Target Date 08/06/21      PT LONG TERM GOAL #4   Title Pt will decrease quick DASH score by at least 8% in order to demonstrate clinically significant reduction in disability.    Baseline 06/17/21: 50%    Time 8    Period Weeks    Status New    Target Date 08/06/21                   Plan - 07/01/21 1352     Clinical Impression Statement Patient demonstrates excellent motivation during session today.  Continued with passive accessory mobilizations to left glenohumeral joint and left first rib.  Also continued with left scalene and left upper trap stretches. Repeated soft tissue mobilization to left pectoralis minor/major wells median nerve glides. Utilized mechanical traction during session today to assess patient response and will repeat in next session if it proves helpful. No HEP progression provided on this date. Pt will benefit from PT services to address deficits in strength and pain in order to return to full function at home and work.    Personal Factors and Comorbidities Comorbidity 2    Comorbidities cervical spondylosis, R carpal tunnel syndrome    Examination-Activity Limitations Carry;Lift    Examination-Participation Restrictions Cleaning;Community Activity;Occupation    Stability/Clinical Decision Making Evolving/Moderate complexity    Rehab Potential Good    PT Frequency 2x / week    PT Duration 8  weeks    PT Treatment/Interventions ADLs/Self Care Home Management;Aquatic Therapy;Canalith Repostioning;Cryotherapy;Electrical Stimulation;Iontophoresis '4mg'$ /ml Dexamethasone;Moist Heat;Traction;Ultrasound;Gait training;Functional mobility training;Therapeutic activities;Therapeutic exercise;Balance training;Neuromuscular re-education;Manual techniques;Dry needling;Vestibular;Spinal Manipulations;Joint Manipulations;Passive range of motion    PT Next Visit Plan Review HEP, continue manual techniques and strengthening    PT Home Exercise Plan Access Code: [email or web address]    Consulted and Agree with Plan of Care Patient             Patient will benefit from skilled therapeutic intervention in order to improve the following deficits and impairments:  Pain, Decreased strength  Visit Diagnosis: Pain in left arm  Muscle weakness (generalized)     Problem List Patient Active Problem List   Diagnosis Date Noted   Cervical spondylosis 06/02/2021   Carpal tunnel syndrome of left wrist 06/02/2021   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 [place], [place], [address] Phone: [phone]   Fax:  [phone]  Name: Charlene Keith MRN: [id number] Date of Birth: May 07, 1966

## 2021-07-03 ENCOUNTER — Ambulatory Visit: Payer: BC Managed Care – PPO

## 2021-07-03 ENCOUNTER — Other Ambulatory Visit: Payer: Self-pay

## 2021-07-03 DIAGNOSIS — M79602 Pain in left arm: Secondary | ICD-10-CM | POA: Diagnosis not present

## 2021-07-03 DIAGNOSIS — M6281 Muscle weakness (generalized): Secondary | ICD-10-CM

## 2021-07-03 NOTE — Therapy (Signed)
[facility] [address] [place], [place], [address] Phone: [phone]   Fax:  [phone]  Physical Therapy Treatment  Patient Details  Name: Charlene Keith MRN: [id number] Date of Birth: 06/28/1966 Referring Provider (PT): Dr. Zigmund Daniel   Encounter Date: 07/03/2021   PT End of Session - 07/03/21 1410     Visit Number 7    Number of Visits 17    Date for PT Re-Evaluation 08/06/21    Authorization Type eval; 06/11/21    PT Start Time 1405    PT Stop Time 1450    PT Time Calculation (min) 45 min    Activity Tolerance Patient tolerated treatment well    Behavior During Therapy [facility] for tasks assessed/performed             Past Medical History:  Diagnosis Date   Ovarian cyst     Past Surgical History:  Procedure Laterality Date   ABDOMINAL HYSTERECTOMY     NOSE SURGERY     WISDOM TOOTH EXTRACTION Bilateral     There were no vitals filed for this visit.   Subjective Assessment - 07/03/21 1408     Subjective Pt reports her L shoulder is doing well.  She has been performing her stretches at home which have been helpful. She reports 0.5/10 resting L shoulder pain upon arrival.    Pertinent History Pt states that she moved into a new house the first week in June. The week prior she was packing up her belongings when she started to experience LUE pain. She has continued to experience L arm pain which is particularly aggravated when lifting with her left upper extremity.  She currently complains of left upper extremity weakness as well.  Pain has improved slightly since it started but is still intermittently painful. She also notes that the pain worsens when it is cold, when she is driving, and when she lays on her L side. Symptoms improve with TENS, medication (Tylenol and Mobic), stretching her L shoulder, ice, massage, and keeping her arm covered with warm clothing. She complains of numbness and tingling in digits 4-5 on her L  hand. Approximately 1 month ago she was bit by a spider on her L hand causing pain and swelling. She was prescribed steroids and symptoms have since resolved.  She saw Dr. Rosette Reveal who diagnosed her with cervical spondylosis (chronic) as well as carpal tunnel syndrome of her left wrist.  Per medical notes recent radiographs reveal scapholunate space widening which may be a confounding element in her wrist mechanics and resultant median nerve involvement.  She was advised to utilize night splinting and wean from wakeful hour brace usage as she progresses with PT. Recent cervical radiographs revealed focal intervertebral involvement at C5-6.  She has seen a chiropractor for her neck pain with the most recent visit being May 16, 2021.  She describes receiving what sounds like cervical traction but it did not have any effect on her left upper extremity pain.  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    Limitations Lifting    Diagnostic tests See history    Patient Stated Goals Decreased LUE pain and improve strength               TREATMENT   Manual Therapy  L pectoralis minor/major stretches into horizontal abduction 2 x 30 seconds, and  overhead 2 x 30 seconds; L scalene and L upper trap stretch 2 x 30s each; L shoulder AP mobilizations, grade III, shoulder at  neutral, 30s/bout x 2 bouts; L shoulder inferior mobilziations, grade III, shoulder at 90 abduction, 30s/bout x 1 bout; L shoulder AP mobilizations, grade III, shoulder at 90 abduction and available end range ER 30s/bout x 2 bouts;   Mechanical Traction After, and separate from manual therapy, sustained mechanical traction performed in supine, 20 degree pull angle and 20# pull, x 10 minutes, decrease in L hand numbness reported. While sitting up after traction pt strains her back and requires 5 minutes of cold pack on low back  afterward with Biofreeze applied afterward. This is a chronic issue for patient;   Ther-ex  Supine left shoulder serratus punch with manual resistance from therapist 2x10; Supine left shoulder flexion with manual resistance from therapist 2x10; Right side-lying left shoulder ER with manual therapy 2x10; Right side-lying left shoulder abduction manual resistance from therapist 2x10; Prone left shoulder extension with 3 pound dumbbell 2x10; Prone left shoulder rows with 3 pound dumbbell 2x10; Prone left shoulder horizontal abduction with 3 pound dumbbell 2x10; Prone left shoulder lower trap flexion with 3 pound dumbbell 2x10;   Pt educated throughout session about proper posture and technique with exercises. Improved exercise technique, movement at target joints, use of target muscles after min to mod verbal, visual, tactile cues.    Patient demonstrates excellent motivation during session today.  Continued with passive accessory mobilizations to left glenohumeral joint.  Also continued with left scalene and left upper trap stretches.  Given low level pain today reintroduced left upper quarter strengthening during session.  Repeated mechanical traction during session today and positive response from last session. No HEP progression provided on this date. Pt will benefit from PT services to address deficits in strength and pain in order to return to full function at home and work.                          PT Short Term Goals - 06/13/21 1551       PT SHORT TERM GOAL #1   Title Pt will be independent with HEP in order to improve strength and decrease L arm/hand in order to improve pain-free function at home and work.    Time 4    Period Weeks    Status New    Target Date 07/09/21               PT Long Term Goals - 06/17/21 1610       PT LONG TERM GOAL #1   Title Pt will decrease worst L arm/hand pain as reported on NPRS by at least 3 points in order to  demonstrate clinically significant reduction in arm/hand pain.    Baseline 06/11/21: worst: 10/10    Time 8    Period Weeks    Status New    Target Date 08/06/21      PT LONG TERM GOAL #2   Title Pt will increase strength of L shoulder flexion and abduction without pain by at least 1/2 MMT grade in order to demonstrate improvement in strength and function.    Baseline 06/11/21: L shoulder flexion: 4/5, L shoulder abduction: 4+/5;    Time 8    Period Weeks    Status New    Target Date 08/06/21      PT LONG TERM GOAL #3   Title Pt  will improve FOTO score to at least 71 in order to demonstrate significant improvement in function related to her LUE pain    Baseline 06/11/21: 59    Time 8    Period Weeks    Status New    Target Date 08/06/21      PT LONG TERM GOAL #4   Title Pt will decrease quick DASH score by at least 8% in order to demonstrate clinically significant reduction in disability.    Baseline 06/17/21: 50%    Time 8    Period Weeks    Status New    Target Date 08/06/21                   Plan - 07/03/21 1451     Clinical Impression Statement Patient demonstrates excellent motivation during session today.  Continued with passive accessory mobilizations to left glenohumeral joint.  Also continued with left scalene and left upper trap stretches.  Given low level pain today reintroduced left upper quarter strengthening during session.  Repeated mechanical traction during session today and positive response from last session. No HEP progression provided on this date. Pt will benefit from PT services to address deficits in strength and pain in order to return to full function at home and work.    Personal Factors and Comorbidities Comorbidity 2    Comorbidities cervical spondylosis, R carpal tunnel syndrome    Examination-Activity Limitations Carry;Lift    Examination-Participation Restrictions Cleaning;Community Activity;Occupation    Stability/Clinical Decision Making  Evolving/Moderate complexity    Rehab Potential Good    PT Frequency 2x / week    PT Duration 8 weeks    PT Treatment/Interventions ADLs/Self Care Home Management;Aquatic Therapy;Canalith Repostioning;Cryotherapy;Electrical Stimulation;Iontophoresis '4mg'$ /ml Dexamethasone;Moist Heat;Traction;Ultrasound;Gait training;Functional mobility training;Therapeutic activities;Therapeutic exercise;Balance training;Neuromuscular re-education;Manual techniques;Dry needling;Vestibular;Spinal Manipulations;Joint Manipulations;Passive range of motion    PT Next Visit Plan Review HEP, continue manual techniques and strengthening    PT Home Exercise Plan Access Code: [email or web address]    Consulted and Agree with Plan of Care Patient             Patient will benefit from skilled therapeutic intervention in order to improve the following deficits and impairments:  Pain, Decreased strength  Visit Diagnosis: Pain in left arm  Muscle weakness (generalized)     Problem List Patient Active Problem List   Diagnosis Date Noted   Cervical spondylosis 06/02/2021   Carpal tunnel syndrome of left wrist 06/02/2021   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 [address] [place], [place], [address] Phone: [phone]   Fax:  [phone]  Name: Charlene Keith MRN: [id number] Date of Birth: 15-Aug-1966

## 2021-07-08 ENCOUNTER — Ambulatory Visit: Payer: BC Managed Care – PPO | Attending: Family Medicine

## 2021-07-08 ENCOUNTER — Other Ambulatory Visit: Payer: Self-pay

## 2021-07-08 DIAGNOSIS — M6281 Muscle weakness (generalized): Secondary | ICD-10-CM | POA: Insufficient documentation

## 2021-07-08 DIAGNOSIS — M79602 Pain in left arm: Secondary | ICD-10-CM | POA: Insufficient documentation

## 2021-07-08 DIAGNOSIS — M79642 Pain in left hand: Secondary | ICD-10-CM | POA: Diagnosis present

## 2021-07-08 NOTE — Therapy (Signed)
[facility] [address] [place], [place], [address] Phone: [phone]   Fax:  [phone]  Physical Therapy Treatment/Goal Update  Patient Details  Name: Charlene Keith MRN: [id number] Date of Birth: 25-Feb-1966 Referring Provider (PT): Dr. Zigmund Daniel   Encounter Date: 07/08/2021   PT End of Session - 07/08/21 1404     Visit Number 8    Number of Visits 17    Date for PT Re-Evaluation 08/06/21    Authorization Type eval; 06/11/21    PT Start Time 1405    PT Stop Time 1450    PT Time Calculation (min) 45 min    Activity Tolerance Patient tolerated treatment well    Behavior During Therapy [name] for tasks assessed/performed             Past Medical History:  Diagnosis Date   Ovarian cyst     Past Surgical History:  Procedure Laterality Date   ABDOMINAL HYSTERECTOMY     NOSE SURGERY     WISDOM TOOTH EXTRACTION Bilateral     There were no vitals filed for this visit.   Subjective Assessment - 07/08/21 1404     Subjective Pt reports her L shoulder is doing well.  She has been performing her stretches at home which have been helpful. She has made a lot of progress but reports continued pain if she lifts anything heavy. She reports 1-2/10 resting L shoulder pain upon arrival. She continues to have numbness and pain in her L 4th and 5th digits.    Pertinent History Pt states that she moved into a new house the first week in June. The week prior she was packing up her belongings when she started to experience LUE pain. She has continued to experience L arm pain which is particularly aggravated when lifting with her left upper extremity.  She currently complains of left upper extremity weakness as well.  Pain has improved slightly since it started but is still intermittently painful. She also notes that the pain worsens when it is cold, when she is driving, and when she lays on her L side. Symptoms improve with TENS, medication (Tylenol  and Mobic), stretching her L shoulder, ice, massage, and keeping her arm covered with warm clothing. She complains of numbness and tingling in digits 4-5 on her L hand. Approximately 1 month ago she was bit by a spider on her L hand causing pain and swelling. She was prescribed steroids and symptoms have since resolved.  She saw Dr. Rosette Reveal who diagnosed her with cervical spondylosis (chronic) as well as carpal tunnel syndrome of her left wrist.  Per medical notes recent radiographs reveal scapholunate space widening which may be a confounding element in her wrist mechanics and resultant median nerve involvement.  She was advised to utilize night splinting and wean from wakeful hour brace usage as she progresses with PT. Recent cervical radiographs revealed focal intervertebral involvement at C5-6.  She has seen a chiropractor for her neck pain with the most recent visit being May 16, 2021.  She describes receiving what sounds like cervical traction but it did not have any effect on her left upper extremity pain.  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    Limitations Lifting    Diagnostic tests See history    Patient Stated Goals Decreased LUE pain and improve strength  TREATMENT   Manual Therapy  L pectoralis minor/major stretches into horizontal abduction 2 x 30 seconds, and overhead 2 x 30 seconds; STM to L pec major, minor, and subscapularis with trigger point release long duration holds;   Ther-ex  UBE x 4 minutes (2 minutes forward/2 minutes backward) for warm-up during history (2 minutes unbilled); Updated outcome measures/goals with patient: QuickDASH: 13.6% FOTO: 67 Worst pain: 2/10;   Strength R/L 4+/4+ Shoulder flexion (anterior deltoid/pec major/coracobrachialis, axillary n. (C5/6) and musculocutaneous n. (C5-7)) 5/4+ Shoulder abduction  (deltoid/supraspinatus, axillary/suprascapular n, C5) 5/4+ Elbow flexion (biceps brachii, brachialis, brachioradialis, musculoskeletal n, C5/6) 5/4+ Elbow extension (triceps, radial n, C7) 5/5 Forearm Pronation 5/5 Forearm Supination 5/5 Wrist Extension (C6/7) 5/5 Wrist Flexion (C6/7) 5/5 Finger adduction (interossei, ulnar n, T1) 5/5 Finger abduction Thumb extension: still weaker on L side; Grip strength: R: 39.2, 37.9, 41.0 (39.4#)  L: 29.1, 27.5, 29.4 (28.7#)  Prone left shoulder extension with 3 pound dumbbell x10; Prone left shoulder low and high rows with 3 pound dumbbell x10; Prone left shoulder horizontal abduction with 3 pound dumbbell 210; Prone left shoulder lower trap flexion with 3 pound dumbbell x10; Standing L shoulder IR with green tband x 10 (added to HEP);   Trigger Point Dry Needling (TDN), unbilled Education performed with patient regarding potential benefit of TDN. Reviewed precautions and risks with patient. Reviewed special precautions/risks over lung fields which include pneumothorax. Reviewed signs and symptoms of pneumothorax and advised pt to go to ER immediately if these symptoms develop advise them of dry needling treatment. Extensive time spent with pt to ensure full understanding of TDN risks. Pt provided verbal consent to treatment. TDN performed to  L pec major with 1, 0.30 x 50 single needle placement and L subscapularis 2, 0.30 x 50 single needle placements (anterior approach with arm overhead). Pt reports deep ache with all three placements.  Pistoning technique utilized.    Pt educated throughout session about proper posture and technique with exercises. Improved exercise technique, movement at target joints, use of target muscles after min to mod verbal, visual, tactile cues.    Patient demonstrates excellent motivation during session today. Updated outcome measures and goals with patient. Her QuickDASH score has decreased from 50% at initial evaluation  to 13.6% today. Her FOTO score improved from 59 at initial evaluation to 67 today. Her L shoulder flexion as well as forearm pronation and supination strength have all improved. Her L grip strength has also improved significantly but is still weaker than her R side. Her worst LUE pain has decreased from 10/10 at initial evaluation to 2/10 today. Continued with L shoulder strengthening during session but also utilized trigger point dry needling today. Added resisted L shoulder IR strengthening due to weakness in L shoulder IR noted during prone testing. Pt will benefit from PT services to address deficits in strength and pain in order to return to full function at home and work.                          PT Short Term Goals - 07/08/21 1635       PT SHORT TERM GOAL #1   Title Pt will be independent with HEP in order to improve strength and decrease L arm/hand in order to improve pain-free function at home and work.    Time 4    Period Weeks    Status On-going    Target Date 07/09/21  PT Long Term Goals - 07/08/21 1636       PT LONG TERM GOAL #1   Title Pt will decrease worst L arm/hand pain as reported on NPRS by at least 3 points in order to demonstrate clinically significant reduction in arm/hand pain.    Baseline 06/11/21: worst: 10/10; 07/08/21: worst: 2/10    Time 8    Period Weeks    Status Achieved      PT LONG TERM GOAL #2   Title Pt will increase strength of L shoulder flexion and abduction without pain by at least 1/2 MMT grade in order to demonstrate improvement in strength and function.    Baseline 06/11/21: L shoulder flexion: 4/5, L shoulder abduction: 4+/5; 07/08/21: L shoulder flexion: 4+/5, L shoulder abduction: 4+/5    Time 8    Period Weeks    Status Partially Met    Target Date 08/06/21      PT LONG TERM GOAL #3   Title Pt will improve FOTO score to at least 71 in order to demonstrate significant improvement in function related to her  LUE pain    Baseline 06/11/21: 59; 07/08/21: 67    Time 8    Period Weeks    Status Partially Met    Target Date 08/06/21      PT LONG TERM GOAL #4   Title Pt will decrease quick DASH score by at least 8% in order to demonstrate clinically significant reduction in disability.    Baseline 06/17/21: 50%; 07/08/21: 13.6%    Time 8    Period Weeks    Status Achieved                   Plan - 07/08/21 1405     Clinical Impression Statement Patient demonstrates excellent motivation during session today. Updated outcome measures and goals with patient. Her QuickDASH score has decreased from 50% at initial evaluation to 13.6% today. Her FOTO score improved from 59 at initial evaluation to 67 today. Her L shoulder flexion as well as forearm pronation and supination strength have all improved. Her L grip strength has also improved significantly but is still weaker than her R side. Her worst LUE pain has decreased from 10/10 at initial evaluation to 2/10 today. Continued with L shoulder strengthening during session but also utilized trigger point dry needling today. Added resisted L shoulder IR strengthening due to weakness in L shoulder IR noted during prone testing. Pt will benefit from PT services to address deficits in strength and pain in order to return to full function at home and work.    Personal Factors and Comorbidities Comorbidity 2    Comorbidities cervical spondylosis, R carpal tunnel syndrome    Examination-Activity Limitations Carry;Lift    Examination-Participation Restrictions Cleaning;Community Activity;Occupation    Stability/Clinical Decision Making Evolving/Moderate complexity    Rehab Potential Good    PT Frequency 2x / week    PT Duration 8 weeks    PT Treatment/Interventions ADLs/Self Care Home Management;Aquatic Therapy;Canalith Repostioning;Cryotherapy;Electrical Stimulation;Iontophoresis 59m/ml Dexamethasone;Moist Heat;Traction;Ultrasound;Gait training;Functional mobility  training;Therapeutic activities;Therapeutic exercise;Balance training;Neuromuscular re-education;Manual techniques;Dry needling;Vestibular;Spinal Manipulations;Joint Manipulations;Passive range of motion    PT Next Visit Plan Review HEP, continue manual techniques and strengthening    PT Home Exercise Plan Access Code: [email or web address]   Consulted and Agree with Plan of Care Patient             Patient will benefit from skilled therapeutic intervention in order to improve the following deficits and impairments:  Pain, Decreased strength  Visit Diagnosis: Pain in left arm  Muscle weakness (generalized)     Problem List Patient Active Problem List   Diagnosis Date Noted   Cervical spondylosis 06/02/2021   Carpal tunnel syndrome of left wrist 06/02/2021   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 [place], [place], [address] Phone: [phone]   Fax:  [phone]  Name: Charlene Keith MRN: [id number] Date of Birth: December 24, 1965

## 2021-07-08 NOTE — Patient Instructions (Signed)
Access Code: [email or web address] URL: [email or web address] Date: 07/08/2021 Prepared by: Roxana Hires  Exercises Doorway Pec Stretch at 60 Degrees Abduction with Arm Straight (Mirrored) - 2 x daily - 7 x weekly - 3 reps - 30s hold Doorway Pec Stretch at 120 Elevation with Arm Straight (Mirrored) - 2 x daily - 7 x weekly - 3 reps - 30s hold Median Nerve Flossing - Tray - 2 x daily - 7 x weekly - 2 sets - 10 reps - 5s hold Seated Median Nerve Glide - 2 x daily - 7 x weekly - 2 sets - 10 reps - 5s hold Sidelying Shoulder ER with Towel and Dumbbell - 1 x daily - 7 x weekly - 2 sets - 10 reps - 3s hold Sidelying Shoulder Abduction Full Range of Motion with Dumbbell - 1 x daily - 7 x weekly - 2 sets - 10 reps - 3s hold Supine Shoulder Flexion with Free Weight - 1 x daily - 7 x weekly - 2 sets - 10 reps - 3s hold Shoulder Internal Rotation with Resistance - 1 x daily - 7 x weekly - 2 sets - 10 reps - 3s hold

## 2021-07-10 ENCOUNTER — Ambulatory Visit: Payer: BC Managed Care – PPO | Admitting: Physical Therapy

## 2021-07-10 ENCOUNTER — Other Ambulatory Visit: Payer: Self-pay

## 2021-07-10 DIAGNOSIS — M6281 Muscle weakness (generalized): Secondary | ICD-10-CM

## 2021-07-10 DIAGNOSIS — M79642 Pain in left hand: Secondary | ICD-10-CM

## 2021-07-10 DIAGNOSIS — M79602 Pain in left arm: Secondary | ICD-10-CM

## 2021-07-10 NOTE — Therapy (Signed)
[facility] [address] [place], [place], [address] Phone: [phone]   Fax:  [phone]  Physical Therapy Treatment  Patient Details  Name: Charlene Keith MRN: [id number] Date of Birth: 1966-03-09 Referring Provider (PT): Dr. Zigmund Daniel   Encounter Date: 07/10/2021   PT End of Session - 07/10/21 1502     Visit Number 9    Number of Visits 17    Date for PT Re-Evaluation 08/06/21    Authorization Type eval; 06/11/21    PT Start Time 1355    PT Stop Time 1445    PT Time Calculation (min) 50 min    Activity Tolerance Patient tolerated treatment well    Behavior During Therapy [facility] for tasks assessed/performed             Past Medical History:  Diagnosis Date   Ovarian cyst     Past Surgical History:  Procedure Laterality Date   ABDOMINAL HYSTERECTOMY     NOSE SURGERY     WISDOM TOOTH EXTRACTION Bilateral     There were no vitals filed for this visit.   Subjective Assessment - 07/10/21 1359     Subjective Pt reports her L shoulder is doing well.  She has been performing her stretches at home which have been helpful. Denies resting L shoulder pain upon arrival. She continues to experience numbness and pain in her L 4th and 5th digits. No questions on HEP at this time.    Pertinent History Pt states that she moved into a new house the first week in June. The week prior she was packing up her belongings when she started to experience LUE pain. She has continued to experience L arm pain which is particularly aggravated when lifting with her left upper extremity.  She currently complains of left upper extremity weakness as well.  Pain has improved slightly since it started but is still intermittently painful. She also notes that the pain worsens when it is cold, when she is driving, and when she lays on her L side. Symptoms improve with TENS, medication (Tylenol and Mobic), stretching her L shoulder, ice, massage, and keeping her  arm covered with warm clothing. She complains of numbness and tingling in digits 4-5 on her L hand. Approximately 1 month ago she was bit by a spider on her L hand causing pain and swelling. She was prescribed steroids and symptoms have since resolved.  She saw Dr. Rosette Reveal who diagnosed her with cervical spondylosis (chronic) as well as carpal tunnel syndrome of her left wrist.  Per medical notes recent radiographs reveal scapholunate space widening which may be a confounding element in her wrist mechanics and resultant median nerve involvement.  She was advised to utilize night splinting and wean from wakeful hour brace usage as she progresses with PT. Recent cervical radiographs revealed focal intervertebral involvement at C5-6.  She has seen a chiropractor for her neck pain with the most recent visit being May 16, 2021.  She describes receiving what sounds like cervical traction but it did not have any effect on her left upper extremity pain.  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    Limitations Lifting    Diagnostic tests See history    Patient Stated Goals Decreased LUE pain and improve strength    Currently in Pain? No/denies  TREATMENT     Manual Therapy  L pectoralis minor/major stretches into horizontal abduction 2 x 30 seconds, and overhead 2 x 30 seconds; L scalene and L upper trap stretch 2 x 30s each; STM to L pec major, minor, and subscapularis with trigger point release long duration holds; L median and ulnar nerve glides x10 each.     Ther-ex  UBE x 4 minutes (2 minutes forward/2 minutes backward) for warm-up during history (2 minutes unbilled);  Supine left shoulder serratus punch with manual resistance from therapist 2x10; Supine left shoulder flexion with manual resistance from therapist 2x10; Right side-lying left shoulder ER with manual therapy  2x10; Right side-lying left shoulder abduction manual resistance from therapist 2x10 Prone left shoulder extension with 3 pound dumbbell x10; Prone left shoulder low and high rows with 3 pound dumbbell x10 each; Prone left shoulder horizontal abduction with 3 pound dumbbell 2x10; Prone left shoulder lower trap flexion with 3 pound dumbbell x10; Standing L shoulder IR with green tband x 10 (added to HEP);     Pt educated throughout session about proper posture and technique with exercises. Improved exercise technique, movement at target joints, use of target muscles after min to mod verbal, visual, tactile cues.     Patient demonstrates excellent motivation during session today. Manual therapy and strengthening techniques were continued with the addition of ulnar nerve glides. Pt demonstrates fatigue with completion of each therapeutic exercise. She was educated on independently performed median and ulnar nerve glides to be performed with HEP. Pt also provided with video links for reference. Pt will benefit from PT services to address deficits in strength and pain in order to return to full function at home and work.         PT Short Term Goals - 07/08/21 1635       PT SHORT TERM GOAL #1   Title Pt will be independent with HEP in order to improve strength and decrease L arm/hand in order to improve pain-free function at home and work.    Time 4    Period Weeks    Status On-going    Target Date 07/09/21               PT Long Term Goals - 07/08/21 1636       PT LONG TERM GOAL #1   Title Pt will decrease worst L arm/hand pain as reported on NPRS by at least 3 points in order to demonstrate clinically significant reduction in arm/hand pain.    Baseline 06/11/21: worst: 10/10; 07/08/21: worst: 2/10    Time 8    Period Weeks    Status Achieved      PT LONG TERM GOAL #2   Title Pt will increase strength of L shoulder flexion and abduction without pain by at least 1/2 MMT grade in  order to demonstrate improvement in strength and function.    Baseline 06/11/21: L shoulder flexion: 4/5, L shoulder abduction: 4+/5; 07/08/21: L shoulder flexion: 4+/5, L shoulder abduction: 4+/5    Time 8    Period Weeks    Status Partially Met    Target Date 08/06/21      PT LONG TERM GOAL #3   Title Pt will improve FOTO score to at least 71 in order to demonstrate significant improvement in function related to her LUE pain    Baseline 06/11/21: 59; 07/08/21: 67    Time 8    Period Weeks    Status Partially Met  Target Date 08/06/21      PT LONG TERM GOAL #4   Title Pt will decrease quick DASH score by at least 8% in order to demonstrate clinically significant reduction in disability.    Baseline 06/17/21: 50%; 07/08/21: 13.6%    Time 8    Period Weeks    Status Achieved                   Plan - 07/10/21 1502     Clinical Impression Statement Patient demonstrates excellent motivation during session today. Manual therapy and strengthening techniques were continued with the addition of ulnar nerve glides. Pt demonstrates fatigue with completion of each therapeutic exercise. She was educated on independently performed median and ulnar nerve glides to be performed with HEP. Pt also provided with video links for reference. Pt will benefit from PT services to address deficits in strength and pain in order to return to full function at home and work.    Personal Factors and Comorbidities Comorbidity 2    Comorbidities cervical spondylosis, R carpal tunnel syndrome    Examination-Activity Limitations Carry;Lift    Examination-Participation Restrictions Cleaning;Community Activity;Occupation    Stability/Clinical Decision Making Evolving/Moderate complexity    Rehab Potential Good    PT Frequency 2x / week    PT Duration 8 weeks    PT Treatment/Interventions ADLs/Self Care Home Management;Aquatic Therapy;Canalith Repostioning;Cryotherapy;Electrical Stimulation;Iontophoresis 25m/ml  Dexamethasone;Moist Heat;Traction;Ultrasound;Gait training;Functional mobility training;Therapeutic activities;Therapeutic exercise;Balance training;Neuromuscular re-education;Manual techniques;Dry needling;Vestibular;Spinal Manipulations;Joint Manipulations;Passive range of motion    PT Next Visit Plan Review HEP, continue manual techniques and strengthening    PT Home Exercise Plan Access Code: [email or web address]   Consulted and Agree with Plan of Care Patient             Patient will benefit from skilled therapeutic intervention in order to improve the following deficits and impairments:  Pain, Decreased strength  Visit Diagnosis: Pain in left arm  Muscle weakness (generalized)  Pain in left hand     Problem List Patient Active Problem List   Diagnosis Date Noted   Cervical spondylosis 06/02/2021   Carpal tunnel syndrome of left wrist 06/02/2021   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 [place] [address]Phone: [phone]  Fax:  [phone] Name: Charlene HelserMRN: [id number] of Birth: 3July 16, 1967

## 2021-07-11 ENCOUNTER — Encounter: Payer: Self-pay | Admitting: Family Medicine

## 2021-07-11 ENCOUNTER — Ambulatory Visit: Payer: BC Managed Care – PPO | Admitting: Family Medicine

## 2021-07-11 VITALS — BP 110/70 | HR 76 | Temp 98.7°F | Ht 62.5 in | Wt 170.0 lb

## 2021-07-11 DIAGNOSIS — M47812 Spondylosis without myelopathy or radiculopathy, cervical region: Secondary | ICD-10-CM

## 2021-07-11 DIAGNOSIS — M5412 Radiculopathy, cervical region: Secondary | ICD-10-CM | POA: Insufficient documentation

## 2021-07-11 MED ORDER — GABAPENTIN 100 MG PO CAPS: ORAL_CAPSULE | ORAL | 1 refills | Status: DC

## 2021-07-11 NOTE — Progress Notes (Signed)
Primary Care / Sports Medicine Office Visit  Patient Information:  Patient ID: Charlene Keith, female DOB: [birth year] Age: 55 y.o. MRN: [id number]   Charlene Keith is a pleasant 55 y.o. female presenting with the following:  Chief Complaint  Patient presents with   Shoulder Pain    L) shoulder- no pain today- doing well on med. 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   cyclobenzaprine (FLEXERIL) 10 MG tablet Take 1 tablet (10 mg total) by mouth at bedtime as needed for muscle spasms. One half to one tab PO qHS, then increase gradually to one tab TID.   estradiol (CLIMARA - DOSED IN MG/24 HR) 0.05 mg/24hr patch Place 1 patch onto the skin once a week.   fluticasone (CUTIVATE) 0.005 % ointment Apply 1 application topically as needed.   gabapentin (NEURONTIN) 100 MG capsule One tab PO qHS for a week, then BID for a week, then TID.   Magnesium 500 MG TABS Take 500 mg by mouth daily.   meloxicam (MOBIC) 15 MG tablet Take 1 tablet (15 mg total) by mouth daily as needed for pain.   tretinoin (RETIN-A) 0.025 % cream Apply 1 application topically at bedtime.   vitamin B-12 (CYANOCOBALAMIN) 1000 MCG tablet Take 1,000 mcg by mouth daily.   No facility-administered encounter medications on file as of 07/11/2021.   Past Surgical History:  Procedure Laterality Date    ABDOMINAL HYSTERECTOMY     NOSE SURGERY     WISDOM TOOTH EXTRACTION Bilateral     Vitals:   07/11/21 1520  BP: 110/70  Pulse: 76  Temp: 98.7 F (37.1 C)   Vitals:   07/11/21 1520  Weight: 170 lb (77.1 kg)  Height: 5' 2.5" (1.588 m)   Body mass index is 30.6 kg/m.  No results found.   Independent interpretation of notes and tests performed by another provider:   None  Procedures performed:   None  Pertinent History, Exam, Impression, and Recommendations:   Cervical radiculopathy at C8 Her chronic bilateral, left greater than right hand symptoms appear to primarily be in the C8 distribution where, upon review of her previous cervical spine films, shows focal involvement. Her examination shows equivocal Spurling's left, negative right, Tinel's at right wrist negative, left side was positive, Phalen's equivocal bilaterally, sensation is diminished ulnar hand, L > R, additionally C8 distribution bilaterally, L > R reduction.   Plan for MRI, continued home exercises, initiation of gabapentin with Rx and titration instructions. She will return in 6 weeks for reevaluation, pending MRI and interval progress, next steps can be referral to spine & pain. For improvement, maintenance measures to be outlined.  Cervical spondylosis See additional assessment(s) for plan details.   Orders & Medications Meds ordered this encounter  Medications   gabapentin (NEURONTIN) 100 MG capsule    Sig: One  tab PO qHS for a week, then BID for a week, then TID.    Dispense:  90 capsule    Refill:  1   Orders Placed This Encounter  Procedures   MR Cervical Spine Wo Contrast     Return in about 6 weeks (around 08/22/2021).     Montel Culver, MD   Primary Care Sports Medicine [place]

## 2021-07-11 NOTE — Patient Instructions (Signed)
-   Continue with home exercises as instructed by physical therapy, focus on slow and steady advance - Start new medication gabapentin, follow Rx instructions and gradually increase every week, can stop at the dose where symptoms are adequately controlled - Referral coordinator will contact you for MRI scheduling - We will review the results through [place] - Return for follow-up in 6 weeks, contact for questions between now and then

## 2021-07-12 ENCOUNTER — Other Ambulatory Visit: Payer: Self-pay | Admitting: Family Medicine

## 2021-07-12 DIAGNOSIS — M62838 Other muscle spasm: Secondary | ICD-10-CM

## 2021-07-12 DIAGNOSIS — M25532 Pain in left wrist: Secondary | ICD-10-CM

## 2021-07-12 DIAGNOSIS — M533 Sacrococcygeal disorders, not elsewhere classified: Secondary | ICD-10-CM

## 2021-07-12 NOTE — Telephone Encounter (Signed)
Requested medication (s) are due for refill today: yes  Requested medication (s) are on the active medication list: yes  Last refill:  06/02/21 #30  Future visit scheduled: yes  Notes to clinic:  No results found for Hgb or Cr   Requested Prescriptions  Pending Prescriptions Disp Refills   meloxicam (MOBIC) 15 MG tablet [Pharmacy Med Name: MELOXICAM 15 MG TABLET] 30 tablet 0    Sig: TAKE 1 TABLET BY MOUTH EVERY DAY AS NEEDED FOR PAIN      Analgesics:  COX2 Inhibitors Failed - 07/12/2021  9:34 AM      Failed - HGB in normal range and within 360 days    No results found for: HGB, HGBKUC, HGBPOCKUC, HGBOTHER, TOTHGB, HGBPLASMA        Failed - Cr in normal range and within 360 days    No results found for: CREATININE, LABCREAU, [place], [place] - Patient is not pregnant      Passed - Valid encounter within last 12 months    Recent Outpatient Visits           Yesterday Cervical radiculopathy at [facility] Montel Culver, MD   1 month ago Cervical spondylosis   [facility] Montel Culver, MD   1 month ago Cervical paraspinal muscle spasm   [facility] Montel Culver, MD       Future Appointments             In 1 month Zigmund Daniel, Earley Abide, MD [facility], [facility]

## 2021-07-13 NOTE — Assessment & Plan Note (Signed)
See additional assessment(s) for plan details. 

## 2021-07-13 NOTE — Assessment & Plan Note (Signed)
Her chronic bilateral, left greater than right hand symptoms appear to primarily be in the C8 distribution where, upon review of her previous cervical spine films, shows focal involvement. Her examination shows equivocal Spurling's left, negative right, Tinel's at right wrist negative, left side was positive, Phalen's equivocal bilaterally, sensation is diminished ulnar hand, L > R, additionally C8 distribution bilaterally, L > R reduction.   Plan for MRI, continued home exercises, initiation of gabapentin with Rx and titration instructions. She will return in 6 weeks for reevaluation, pending MRI and interval progress, next steps can be referral to spine & pain. For improvement, maintenance measures to be outlined.

## 2021-07-14 ENCOUNTER — Ambulatory Visit: Payer: BC Managed Care – PPO | Admitting: Family Medicine

## 2021-07-14 NOTE — Telephone Encounter (Signed)
Refill if appropriate.  Please advise. Patient has an appointment 08/25/21.

## 2021-07-15 ENCOUNTER — Ambulatory Visit: Payer: BC Managed Care – PPO

## 2021-08-25 ENCOUNTER — Other Ambulatory Visit: Payer: Self-pay

## 2021-08-25 ENCOUNTER — Ambulatory Visit: Payer: BC Managed Care – PPO | Admitting: Family Medicine

## 2021-08-25 ENCOUNTER — Encounter: Payer: Self-pay | Admitting: Family Medicine

## 2021-08-25 VITALS — BP 98/60 | HR 76 | Temp 97.9°F | Ht 62.5 in | Wt 169.0 lb

## 2021-08-25 DIAGNOSIS — M47812 Spondylosis without myelopathy or radiculopathy, cervical region: Secondary | ICD-10-CM

## 2021-08-25 DIAGNOSIS — M5412 Radiculopathy, cervical region: Secondary | ICD-10-CM

## 2021-08-25 DIAGNOSIS — M533 Sacrococcygeal disorders, not elsewhere classified: Secondary | ICD-10-CM

## 2021-08-25 DIAGNOSIS — M25532 Pain in left wrist: Secondary | ICD-10-CM | POA: Diagnosis not present

## 2021-08-25 DIAGNOSIS — M62838 Other muscle spasm: Secondary | ICD-10-CM

## 2021-08-25 DIAGNOSIS — Z23 Encounter for immunization: Secondary | ICD-10-CM | POA: Diagnosis not present

## 2021-08-25 MED ORDER — CYCLOBENZAPRINE HCL 5 MG PO TABS: 5.0000 mg | ORAL_TABLET | Freq: Every evening | ORAL | 0 refills | Status: DC | PRN

## 2021-08-25 MED ORDER — DULOXETINE HCL 30 MG PO CPEP: ORAL_CAPSULE | ORAL | 0 refills | Status: DC

## 2021-08-25 NOTE — Patient Instructions (Signed)
-   Continue with home exercises instructed to you by physical therapy - Incorporate new exercises provided today - Stop gabapentin - Start duloxetine (Cymbalta) x2 weeks, if tolerating well, increase to 2 capsules nightly until follow-up - Can dose 5 mg cyclobenzaprine (muscle relaxer) nightly on an as-needed basis - Return for follow-up for annual physical in 4 weeks

## 2021-08-26 NOTE — Progress Notes (Signed)
Primary Care / Sports Medicine Office Visit  Patient Information:  Patient ID: Charlene Keith, female DOB: 04-16-1966 Age: 55 y.o. MRN: [id number]   Charlene Keith is a pleasant 55 y.o. 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   DULoxetine (CYMBALTA) 30 MG capsule Take 1 capsule (30 mg total) by mouth every evening for 14 days, THEN 2 capsules (60 mg total) every evening for 16 days.   estradiol (CLIMARA - DOSED IN MG/24 HR) 0.05 mg/24hr patch Place 1 patch onto the skin once a week.   fluticasone (CUTIVATE) 0.005 % ointment Apply 1 application topically as needed.   Magnesium 500 MG TABS Take 500 mg by mouth daily.   meloxicam (MOBIC) 15 MG tablet TAKE 1 TABLET BY MOUTH EVERY DAY AS NEEDED FOR PAIN   tretinoin (RETIN-A) 0.025 % cream Apply 1 application topically at bedtime.   vitamin B-12 (CYANOCOBALAMIN) 1000 MCG tablet Take 1,000 mcg by mouth daily.   [DISCONTINUED] gabapentin (NEURONTIN) 100 MG capsule One tab PO qHS for a week, then BID for a week, then TID.   cyclobenzaprine (FLEXERIL) 5 MG tablet Take 1-2 tablets (5-10 mg total) by mouth at bedtime as needed for muscle spasms. One half  to one tab PO qHS, then increase gradually to one tab TID.   [DISCONTINUED] cyclobenzaprine (FLEXERIL) 10 MG tablet Take 1 tablet (10 mg total) by mouth at bedtime as needed for muscle spasms. One half to one tab PO qHS, then increase gradually to one tab TID. (Patient not taking: Reported on 08/25/2021)   No facility-administered encounter medications on file as of 08/25/2021.   Past Surgical History:  Procedure Laterality Date   ABDOMINAL HYSTERECTOMY     NOSE SURGERY     WISDOM TOOTH EXTRACTION Bilateral     Vitals:   08/25/21 1321  BP: 98/60  Pulse: 76  Temp: 97.9 F (36.6 C)  SpO2: 99%   Vitals:   08/25/21 1321  Weight: 169 lb (76.7 kg)  Height: 5' 2.5" (1.588 m)   Body mass index is 30.42 kg/m.  No results found.   Independent interpretation of notes and tests performed by another provider:   None  Procedures performed:   None  Pertinent History, Exam, Impression, and Recommendations:   Cervical spondylosis Patient is demonstrated interval continued symptomatology from this chronic issue.  Patient is unable to tolerate recently prescribed gabapentin citing excessive drowsiness.  Examination today reveals prominent paraspinal cervical spasm, radicular features were reported, and she is intact from sensorimotor standpoint in bilateral upper extremities yesterday's examination.  We have previously attempted to obtain MRI, we will wait for these results when she is authorized to obtain this test.  In  the interim I have advised initiation of duloxetine with plan to titrate it 2 weeks if tolerating well.  Additionally, I have written for home-based rehab exercises for spinal conditioning that she can incorporate with her previously provided exercises through physical therapy.  We will follow-up on these issues at her annual physical.  Cervical radiculopathy at C8 See additional assessment(s) for plan details.  Cervical paraspinal muscle spasm See additional assessment(s)  for plan details.    Orders & Medications Meds ordered this encounter  Medications   DULoxetine (CYMBALTA) 30 MG capsule    Sig: Take 1 capsule (30 mg total) by mouth every evening for 14 days, THEN 2 capsules (60 mg total) every evening for 16 days.    Dispense:  46 capsule    Refill:  0   cyclobenzaprine (FLEXERIL) 5 MG tablet    Sig: Take 1-2 tablets (5-10 mg total) by mouth at bedtime as needed for muscle spasms. One half to one tab PO qHS, then increase gradually to one tab TID.    Dispense:  60 tablet    Refill:  0   Orders Placed This Encounter  Procedures   Flu Vaccine QUAD 16mo+IM (Fluarix, Fluzone & Alfiuria Quad PF)     Return in about 4 weeks (around 09/22/2021).     Montel Culver, MD   Primary Care Sports Medicine [place]

## 2021-08-26 NOTE — Assessment & Plan Note (Signed)
See additional assessment(s) for plan details. 

## 2021-08-26 NOTE — Assessment & Plan Note (Addendum)
>>  ASSESSMENT AND PLAN FOR CERVICAL SPONDYLOSIS WITH RADICULOPATHY WRITTEN ON 08/26/2021  3:03 PM BY Lashonne Shull, Ocie Bob, MD  Patient is demonstrated interval continued symptomatology from this chronic issue.  Patient is unable to tolerate recently prescribed gabapentin citing excessive drowsiness.  Examination today reveals prominent paraspinal cervical spasm, radicular features were reported, and she is intact from sensorimotor standpoint in bilateral upper extremities yesterday's examination.  We have previously attempted to obtain MRI, we will wait for these results when she is authorized to obtain this test.  In the interim I have advised initiation of duloxetine with plan to titrate it 2 weeks if tolerating well.  Additionally, I have written for home-based rehab exercises for spinal conditioning that she can incorporate with her previously provided exercises through physical therapy.  We will follow-up on these issues at her annual physical.  >>ASSESSMENT AND PLAN FOR CERVICAL PARASPINAL MUSCLE SPASM WRITTEN ON 08/26/2021  3:03 PM BY Janeka Libman, Ocie Bob, MD  See additional assessment(s) for plan details.

## 2021-08-29 ENCOUNTER — Telehealth: Payer: Self-pay | Admitting: Family Medicine

## 2021-08-29 DIAGNOSIS — M5030 Other cervical disc degeneration, unspecified cervical region: Secondary | ICD-10-CM

## 2021-08-29 DIAGNOSIS — M62838 Other muscle spasm: Secondary | ICD-10-CM

## 2021-08-29 DIAGNOSIS — M47812 Spondylosis without myelopathy or radiculopathy, cervical region: Secondary | ICD-10-CM

## 2021-08-29 DIAGNOSIS — M5412 Radiculopathy, cervical region: Secondary | ICD-10-CM

## 2021-08-29 NOTE — Telephone Encounter (Signed)
Pt is calling returning a call regarding answering some questions before getting her MRI scheduled Please advise [phone]- [phone]

## 2021-09-03 ENCOUNTER — Ambulatory Visit
Admission: RE | Admit: 2021-09-03 | Discharge: 2021-09-03 | Disposition: A | Discharge status: Home / Self Care | Payer: BC Managed Care – PPO | Source: Ambulatory Visit | Attending: Family Medicine | Admitting: Family Medicine

## 2021-09-03 ENCOUNTER — Other Ambulatory Visit: Payer: Self-pay

## 2021-09-03 DIAGNOSIS — M47812 Spondylosis without myelopathy or radiculopathy, cervical region: Secondary | ICD-10-CM | POA: Diagnosis present

## 2021-09-03 DIAGNOSIS — M5412 Radiculopathy, cervical region: Secondary | ICD-10-CM | POA: Diagnosis not present

## 2021-09-04 NOTE — Telephone Encounter (Signed)
-----   Message from Montel Culver, MD sent at 09/04/2021 10:40 AM EDT ----- Please place a referral to [facility] spine [facility] for cervical spondylosis and cervical intervertebral disc degenerative changes.  Thanks

## 2021-09-04 NOTE — Progress Notes (Signed)
Please place a referral to [facility] spine [facility] for cervical spondylosis and cervical intervertebral disc degenerative changes.  Thanks

## 2021-09-16 ENCOUNTER — Other Ambulatory Visit: Payer: Self-pay | Admitting: Family Medicine

## 2021-09-16 DIAGNOSIS — M47812 Spondylosis without myelopathy or radiculopathy, cervical region: Secondary | ICD-10-CM

## 2021-09-16 DIAGNOSIS — M5412 Radiculopathy, cervical region: Secondary | ICD-10-CM

## 2021-09-16 NOTE — Telephone Encounter (Signed)
Requested medications are due for refill today.  Unclear  Requested medications are on the active medications list.  yes  Last refill. 08/25/2021  Future visit scheduled.   yes  Notes to clinic.  It is unclear if this medication should be continued.

## 2021-09-17 NOTE — Telephone Encounter (Signed)
Please confirm that patient is dosing 2 capsules duloxetine 30 mg (60 mg total). If tolerating well, please refill duloxetine 60 mg capsule QHS, #30 with 2 refills. Thank you

## 2021-09-17 NOTE — Telephone Encounter (Signed)
Refill if appropriate.  Please advise. Patient appointment 09/22/21.

## 2021-09-19 NOTE — Telephone Encounter (Signed)
Left message for patient to return call.

## 2021-09-22 ENCOUNTER — Ambulatory Visit: Payer: BC Managed Care – PPO | Admitting: Family Medicine

## 2021-09-22 ENCOUNTER — Other Ambulatory Visit: Payer: Self-pay

## 2021-09-22 ENCOUNTER — Encounter: Payer: Self-pay | Admitting: Family Medicine

## 2021-09-22 DIAGNOSIS — G5602 Carpal tunnel syndrome, left upper limb: Secondary | ICD-10-CM

## 2021-09-22 DIAGNOSIS — M4722 Other spondylosis with radiculopathy, cervical region: Secondary | ICD-10-CM

## 2021-09-22 NOTE — Assessment & Plan Note (Signed)
Persistent left hand paresthesias, this is int he setting of known carpal tunnel syndrome and multilevel cervical spondylosis. The etiology of her hand symptoms can be thought secondary to both the cervical degeneration as well as carpal tunnel, I have reviewed the same and she is amenable to cervical spine evaluation and management through [facility]. For any persistent hand / wrist symptoms, guided median nerve injections can be considered through our office. She will remain on medications on the interim.

## 2021-09-22 NOTE — Patient Instructions (Signed)
-   Follow-up for physical - Continue current medications - Follow-up with pain & spine

## 2021-09-22 NOTE — Progress Notes (Signed)
Primary Care / Sports Medicine Office Visit  Patient Information:  Patient ID: Charlene Keith, female DOB: 1966-04-21 Age: 55 y.o. MRN: [id number]   Charlene Keith is a pleasant 55 y.o. 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   cyclobenzaprine (FLEXERIL) 5 MG tablet Take 1-2 tablets (5-10 mg total) by mouth at bedtime as needed for muscle spasms. One half to one tab PO qHS, then increase gradually to one tab TID.   DULoxetine (CYMBALTA) 30 MG capsule Take 1 capsule (30 mg total) by mouth every evening for 14 days, THEN 2 capsules (60 mg total) every evening for 16 days.   estradiol (CLIMARA - DOSED IN MG/24 HR) 0.05 mg/24hr patch Place 1 patch onto the skin once a week.   fluticasone (CUTIVATE) 0.005 % ointment Apply 1 application topically as needed.   Magnesium 500 MG TABS Take 500 mg by mouth daily.   meloxicam (MOBIC) 15 MG tablet TAKE 1 TABLET BY MOUTH EVERY DAY AS NEEDED FOR PAIN   tretinoin (RETIN-A) 0.025 % cream Apply 1 application topically at bedtime.   vitamin B-12 (CYANOCOBALAMIN) 1000 MCG tablet Take  1,000 mcg by mouth daily.   No facility-administered encounter medications on file as of 09/22/2021.   Past Surgical History:  Procedure Laterality Date   ABDOMINAL HYSTERECTOMY     NOSE SURGERY     WISDOM TOOTH EXTRACTION Bilateral     Vitals:   09/22/21 0818  BP: 108/62  Pulse: 75  SpO2: 98%   Vitals:   09/22/21 0818  Weight: 173 lb (78.5 kg)  Height: 5' 2.5" (1.588 m)   Body mass index is 31.14 kg/m.  MR Cervical Spine Wo Contrast  Result Date: 09/03/2021 CLINICAL DATA:  Cervical radiculopathy at C8. Cervical spondylosis. Cervical radiculopathy, no red flags. Additional history provided by scanning technologist: Patient reports neck pain with left shoulder and left arm pain. EXAM: MRI CERVICAL SPINE WITHOUT CONTRAST TECHNIQUE: Multiplanar, multisequence MR imaging of the cervical spine was performed. No intravenous contrast was administered. COMPARISON:  Radiographs of the cervical spine 05/19/2021. FINDINGS: Alignment: Mild cervical levocurvature. Reversal of the expected cervical lordosis. No significant spondylolisthesis. Vertebrae: Redemonstrated mild chronic anterior wedge deformity of the T1 vertebral body. Vertebral body height is otherwise maintained. No significant marrow edema or focal suspicious osseous lesion. Cord: No signal abnormality identified within the cervical spinal cord. Posterior Fossa, vertebral arteries, paraspinal tissues: No abnormality identified within included portions of the posterior fossa. Flow voids preserved within the imaged cervical vertebral arteries. Paraspinal soft tissues unremarkable. Disc levels: Multilevel disc degeneration, greatest at C4-C5 (moderate) and C5-C6 (moderate/advanced).  C2-C3: Minimal facet arthrosis. No significant disc herniation or stenosis. C3-C4: No significant disc herniation or stenosis. C4-C5: Disc bulge. Superimposed small left center disc protrusion. The disc protrusion results in mild spinal canal narrowing (with  contact upon the ventral spinal cord). No significant foraminal stenosis. C5-C6: Disc bulge with endplate spurring and right greater than left uncovertebral hypertrophy. The disc bulge results in mild relative spinal canal narrowing brain (with contact upon the ventral spinal cord). Mild/moderate right neural foraminal narrowing. C6-C7: Possible small central zone posterior annular fissure. Shallow disc bulge. Minimal partial effacement of the ventral thecal sac (without spinal cord mass effect). No significant foraminal stenosis. C7-T1: No significant disc herniation or stenosis. T1-T2: Imaged sagittally. Disc bulge. Mild partial effacement of the ventral thecal sac (without spinal cord mass effect). No appreciable significant foraminal stenosis. IMPRESSION: Cervical and upper thoracic spondylosis, as outlined and with findings most notably as follows. At C4-C5, there is moderate disc degeneration. Disc bulge. Superimposed small left center disc protrusion. The disc protrusion results in mild spinal canal narrowing (with contact upon the ventral spinal cord). No significant foraminal stenosis. At C5-C6, there is moderate/advanced disc degeneration. Disc bulge with endplate spurring and right greater than left uncovertebral hypertrophy. The disc bulge results in mild relative spinal canal narrowing (with contact upon the ventral spinal cord). Mild/moderate right neural foraminal narrowing. At C6-C7, there is mild disc degeneration. Possible small central zone posterior annular fissure. Shallow disc bulge. Minimal partial effacement of the ventral thecal sac (without spinal cord mass effect). No significant foraminal stenosis. At T1-T2, a disc bulge contributes to mild relative spinal canal narrowing (without spinal cord mass effect). Reversal of the expected cervical lordosis Mild cervical levocurvature. Electronically Signed   By: Kellie Simmering D.O.   On: 09/03/2021 14:54    Images were independently reviewed and  discussion with patient regarding the same was performed. See assessment for details.   Independent interpretation of notes and tests performed by another provider:   None  Procedures performed:   None  Pertinent History, Exam, Impression, and Recommendations:   Cervical spondylosis with radiculopathy Recent MRI detailing the extent of intervertebral involvement with neural impingement. She has had relative stable symptomatology with duloxetine, cyclobenzaprine, and meloxicam. She was unable to tolerate duloxetine 60 mg due to nausea. Her main concern is persistent left greater than right upper extremity paresthesias with involvement to the left hand.   Given her persistent symptomatology and MRI findings, we had placed a referral to [facility] for additional evaluation and management options, she is amenable to follow-up with them. We will track this issue peripherally.  Carpal tunnel syndrome of left wrist Persistent left hand paresthesias, this is int he setting of known carpal tunnel syndrome and multilevel cervical spondylosis. The etiology of her hand symptoms can be thought secondary to both the cervical degeneration as well as carpal tunnel, I have reviewed the same and she is amenable to cervical spine evaluation and management through [facility]. For any persistent hand / wrist symptoms, guided median nerve injections can be considered through our office. She will remain on medications on the interim.    Orders & Medications No orders of the defined types were placed in this encounter.  No orders of the defined types were placed in this encounter.    Return for Next available for physical.     Montel Culver, MD   [place]

## 2021-09-22 NOTE — Assessment & Plan Note (Signed)
Recent MRI detailing the extent of intervertebral involvement with neural impingement. She has had relative stable symptomatology with duloxetine, cyclobenzaprine, and meloxicam. She was unable to tolerate duloxetine 60 mg due to nausea. Her main concern is persistent left greater than right upper extremity paresthesias with involvement to the left hand.   Given her persistent symptomatology and MRI findings, we had placed a referral to [facility] for additional evaluation and management options, she is amenable to follow-up with them. We will track this issue peripherally.

## 2021-10-28 LAB — HM MAMMOGRAPHY

## 2021-11-20 ENCOUNTER — Encounter: Payer: Self-pay | Admitting: Student in an Organized Health Care Education/Training Program

## 2021-11-20 ENCOUNTER — Ambulatory Visit
Payer: BC Managed Care – PPO | Attending: Student in an Organized Health Care Education/Training Program | Admitting: Student in an Organized Health Care Education/Training Program

## 2021-11-20 ENCOUNTER — Other Ambulatory Visit: Payer: Self-pay

## 2021-11-20 VITALS — BP 116/74 | HR 81 | Temp 97.2°F | Ht 63.0 in | Wt 173.0 lb

## 2021-11-20 DIAGNOSIS — G894 Chronic pain syndrome: Secondary | ICD-10-CM | POA: Insufficient documentation

## 2021-11-20 DIAGNOSIS — M47812 Spondylosis without myelopathy or radiculopathy, cervical region: Secondary | ICD-10-CM | POA: Diagnosis present

## 2021-11-20 DIAGNOSIS — M5412 Radiculopathy, cervical region: Secondary | ICD-10-CM | POA: Diagnosis not present

## 2021-11-20 DIAGNOSIS — M5020 Other cervical disc displacement, unspecified cervical region: Secondary | ICD-10-CM | POA: Diagnosis present

## 2021-11-20 NOTE — Patient Instructions (Signed)
Epidural Steroid Injection An epidural steroid injection is a shot of steroid medicine and numbing medicine that is given into the space between the spinal cord and the bones of the back (epidural space). The shot helps relieve pain caused by an irritated or swollen nerve root. The amount of pain relief you get from the injection depends on what is causing the nerve to be swollen and irritated, and how long your pain lasts. You are more likely to benefit from this injection if your pain is strong and comes on suddenly rather than if you have had long-term (chronic) pain. Tell a health care provider about: Any allergies you have. All medicines you are taking, including vitamins, herbs, eye drops, creams, and over-the-counter medicines. Any problems you or family members have had with anesthetic medicines. Any blood disorders you have. Any surgeries you have had. Any medical conditions you have. Whether you are pregnant or may be pregnant. What are the risks? Generally, this is a safe procedure. However, problems may occur, including: Headache. Bleeding. Infection. Allergic reaction to medicines. Nerve damage. What happens before the procedure? Staying hydrated Follow instructions from your health care provider about hydration, which may include: Up to 2 hours before the procedure - you may continue to drink clear liquids, such as water, clear fruit juice, black coffee, and plain tea. Eating and drinking restrictions Follow instructions from your health care provider about eating and drinking, which may include: 8 hours before the procedure - stop eating heavy meals or foods, such as meat, fried foods, or fatty foods. 6 hours before the procedure - stop eating light meals or foods, such as toast or cereal. 6 hours before the procedure - stop drinking milk or drinks that contain milk. 2 hours before the procedure - stop drinking clear liquids. Medicines You may be given medicines to lower  anxiety. Ask your health care provider about: Changing or stopping your regular medicines. This is especially important if you are taking diabetes medicines or blood thinners. Taking medicines such as aspirin and ibuprofen. These medicines can thin your blood. Do not take these medicines unless your health care provider tells you to take them. Taking over-the-counter medicines, vitamins, herbs, and supplements. General instructions Ask your health care provider what steps will be taken to prevent infection. Plan to have a responsible adult take you home from the hospital or clinic. If you will be going home right after the procedure, plan to have a responsible adult care for you for the time you are told. This is important. What happens during the procedure? An IV will be inserted into one of your veins. You will be given one or more of the following: A medicine to help you relax (sedative). A medicine to numb the area (local anesthetic). You will be asked to lie on your abdomen or sit. The injection site will be cleaned. A needle will be inserted through your skin into the epidural space. This may cause you some discomfort. An X-ray machine will be used to guide the needle as close as possible to the affected nerve. A steroid medicine and a local anesthetic will be injected into the epidural space. The needle and IV will be removed. A bandage (dressing) will be put over the injection site. The procedure may vary among health care providers and hospitals. What can I expect after the procedure? Your blood pressure, heart rate, breathing rate, and blood oxygen level will be monitored until you leave the hospital or clinic. Your arm or  leg may feel weak or numb for a few hours. The injection site may feel sore. Follow these instructions at home: Injection site care You may remove the bandage (dressing) after 24 hours. Check your injection site every day for signs of infection. Check  for: Redness, swelling, or pain. Fluid or blood. Warmth. Pus or a bad smell. Managing pain, stiffness, and swelling For 24 hours after the procedure: Avoid using heat on the injection site. Do not take baths, swim, or use a hot tub until your health care provider approves. Ask your health care provider if you may take showers. You may only be allowed to take sponge baths. If directed, put ice on the injection site. To do this: Put ice in a plastic bag. Place a towel between your skin and the bag. Leave the ice on for 20 minutes, 2-3 times a day.  Activity If you were given a sedative during the procedure, it can affect you for several hours. Do not drive or operate machinery until your health care provider says that it is safe. Return to your normal activities as told by your health care provider. Ask your health care provider what activities are safe for you. General instructions Take over-the-counter and prescription medicines only as told by your health care provider. Drink enough fluid to keep your urine pale yellow. Keep all follow-up visits as told by your health care provider. This is important. Contact a health care provider if: You have any of these signs of infection: Redness, swelling, or pain around your injection site. Fluid or blood coming from your injection site. Warmth coming from your injection site. Pus or a bad smell coming from your injection site. A fever. You continue to have pain and soreness around the injection site, even after taking over-the-counter pain medicine. You have severe, sudden, or lasting nausea or vomiting. Get help right away if: You have severe pain at the injection site that is not relieved by medicines. You develop a severe headache or a stiff neck. You become sensitive to light. You have any new numbness or weakness in your legs or arms. You lose control of your bladder or bowel movements. You have trouble breathing. Summary An  epidural steroid injection is a shot of steroid medicine and numbing medicine that is given into the epidural space. The shot helps relieve pain caused by an irritated or swollen nerve root. You are more likely to benefit from this injection if your pain is strong and comes on suddenly rather than if you have had chronic pain. This information is not intended to replace advice given to you by your health care provider. Make sure you discuss any questions you have with your health care provider. Document Revised: 03/22/2020 Document Reviewed: 06/05/2019 [employer] Patient Education  [employer].

## 2021-11-20 NOTE — Progress Notes (Signed)
Patient: Charlene Keith  Service Category: E/M  Provider: Gillis Santa, MD  DOB: Apr 26, 1966  DOS: 11/20/2021  Referring Provider: Montel Culver, MD  MRN: [id number]  Setting: Ambulatory outpatient  PCP: Montel Culver, MD  Type: New Patient  Specialty: Interventional Pain Management    Location: Office  Delivery: Face-to-face     Primary Reason(s) for Visit: Encounter for initial evaluation of one or more chronic problems (new to examiner) potentially causing chronic pain, and posing a threat to normal musculoskeletal function. (Level of risk: High) CC: Neck Pain  HPI  Charlene Keith is a 55 y.o. year old, female patient, who comes for the first time to our practice referred by Zigmund Daniel Earley Abide, MD for our initial evaluation of her chronic pain. She has Chronic idiopathic constipation; Ovarian mass; Pain of left sacroiliac joint; Cervical paraspinal muscle spasm; Cervical spondylosis with radiculopathy; Carpal tunnel syndrome of left wrist; Scapholunate dissociation of left wrist; Cervical radicular pain; Chronic pain syndrome; Cervical disc herniation; and Cervical facet joint syndrome on their problem list. Today she comes in for evaluation of her Neck Pain  Pain Assessment: Location: Right, Left Neck Radiating: shoulders bilater down arms, effects hands to fingers Onset: More than a month ago Duration: Chronic pain Quality: Spasm, Tingling, Numbness Severity: 3 /10 (subjective, self-reported pain score)  Effect on ADL: lifting, pushing things, ADLs Timing: Constant Modifying factors: Streching, Tens unit BP: 116/74   HR: 81  Onset and Duration: Present longer than 3 months Cause of pain: Unknown Severity: NAS-11 at its worse: 8/10, NAS-11 at its best: 6/10, NAS-11 now: 4/10, and NAS-11 on the average: 5/10 Timing: Not influenced by the time of the day Aggravating Factors: Bowel movements, Lifiting, Motion, Prolonged sitting, and driving Alleviating Factors: Stretching, Cold packs,  Lying down, Medications, Resting, Relaxation therapy, and Walking Associated Problems: Numbness, Spasms, Tingling, Pain that wakes patient up, and Pain that does not allow patient to sleep Quality of Pain: Annoying, Sharp, Stabbing, and Uncomfortable Previous Examinations or Tests: Biopsy and MRI scan Previous Treatments: Chiropractic manipulations, Physical Therapy, Relaxation therapy, and Stretching exercises  Charlene Keith is a pleasant 55 year old female who presents with a chief complaint of neck pain with radiation into bilateral upper extremities in a dermatomal fashion, left greater than right.  She has been dealing with this for over 6 months.  No inciting or traumatic event.  She has been referred by Dr. Zigmund Daniel for consideration of cervical spinal intervention.  She has been doing physical therapy for the last 6 months as well as chiropractic therapy.  She utilizes a TENS unit.  She states that the pain makes it difficult for her to rotate her head and driving also exacerbates her pain.  She has having issues with fine motor control and numbness in her fingers.  She also has a history of carpal tunnel and utilizes a brace for that.  Meds   Current Outpatient Medications:    acetaminophen (TYLENOL) 325 MG tablet, Take by mouth every 6 (six) hours as needed., Disp: , Rfl:    Cholecalciferol 50 MCG (2000 UT) CAPS, Take 1 capsule by mouth daily., Disp: , Rfl:    cyclobenzaprine (FLEXERIL) 5 MG tablet, Take 1-2 tablets (5-10 mg total) by mouth at bedtime as needed for muscle spasms. One half to one tab PO qHS, then increase gradually to one tab TID., Disp: 60 tablet, Rfl: 0   estradiol (CLIMARA - DOSED IN MG/24 HR) 0.05 mg/24hr patch, Place 1 patch onto the skin once a  week., Disp: , Rfl:    fluticasone (CUTIVATE) 0.005 % ointment, Apply 1 application topically as needed., Disp: , Rfl:    Magnesium 500 MG TABS, Take 500 mg by mouth daily., Disp: , Rfl:    meloxicam (MOBIC) 15 MG tablet, TAKE 1  TABLET BY MOUTH EVERY DAY AS NEEDED FOR PAIN, Disp: 30 tablet, Rfl: 0   tretinoin (RETIN-A) 0.025 % cream, Apply 1 application topically at bedtime., Disp: , Rfl:    vitamin B-12 (CYANOCOBALAMIN) 1000 MCG tablet, Take 1,000 mcg by mouth daily., Disp: , Rfl:    DULoxetine (CYMBALTA) 30 MG capsule, Take 1 capsule (30 mg total) by mouth every evening for 14 days, THEN 2 capsules (60 mg total) every evening for 16 days., Disp: 46 capsule, Rfl: 0  Imaging Review  Cervical Imaging: Cervical MR wo contrast: Results for orders placed during the hospital encounter of 09/03/21  MR Cervical Spine Wo Contrast  Narrative CLINICAL DATA:  Cervical radiculopathy at C8. Cervical spondylosis. Cervical radiculopathy, no red flags. Additional history provided by scanning technologist: Patient reports neck pain with left shoulder and left arm pain.  EXAM: MRI CERVICAL SPINE WITHOUT CONTRAST  TECHNIQUE: Multiplanar, multisequence MR imaging of the cervical spine was performed. No intravenous contrast was administered.  COMPARISON:  Radiographs of the cervical spine 05/19/2021.  FINDINGS: Alignment: Mild cervical levocurvature. Reversal of the expected cervical lordosis. No significant spondylolisthesis.  Vertebrae: Redemonstrated mild chronic anterior wedge deformity of the T1 vertebral body. Vertebral body height is otherwise maintained. No significant marrow edema or focal suspicious osseous lesion.  Cord: No signal abnormality identified within the cervical spinal cord.  Posterior Fossa, vertebral arteries, paraspinal tissues: No abnormality identified within included portions of the posterior fossa. Flow voids preserved within the imaged cervical vertebral arteries. Paraspinal soft tissues unremarkable.  Disc levels:  Multilevel disc degeneration, greatest at C4-C5 (moderate) and C5-C6 (moderate/advanced).  C2-C3: Minimal facet arthrosis. No significant disc herniation  or stenosis.  C3-C4: No significant disc herniation or stenosis.  C4-C5: Disc bulge. Superimposed small left center disc protrusion. The disc protrusion results in mild spinal canal narrowing (with contact upon the ventral spinal cord). No significant foraminal stenosis.  C5-C6: Disc bulge with endplate spurring and right greater than left uncovertebral hypertrophy. The disc bulge results in mild relative spinal canal narrowing brain (with contact upon the ventral spinal cord). Mild/moderate right neural foraminal narrowing.  C6-C7: Possible small central zone posterior annular fissure. Shallow disc bulge. Minimal partial effacement of the ventral thecal sac (without spinal cord mass effect). No significant foraminal stenosis.  C7-T1: No significant disc herniation or stenosis.  T1-T2: Imaged sagittally. Disc bulge. Mild partial effacement of the ventral thecal sac (without spinal cord mass effect). No appreciable significant foraminal stenosis.  IMPRESSION: Cervical and upper thoracic spondylosis, as outlined and with findings most notably as follows.  At C4-C5, there is moderate disc degeneration. Disc bulge. Superimposed small left center disc protrusion. The disc protrusion results in mild spinal canal narrowing (with contact upon the ventral spinal cord). No significant foraminal stenosis.  At C5-C6, there is moderate/advanced disc degeneration. Disc bulge with endplate spurring and right greater than left uncovertebral hypertrophy. The disc bulge results in mild relative spinal canal narrowing (with contact upon the ventral spinal cord). Mild/moderate right neural foraminal narrowing.  At C6-C7, there is mild disc degeneration. Possible small central zone posterior annular fissure. Shallow disc bulge. Minimal partial effacement of the ventral thecal sac (without spinal cord mass effect). No significant foraminal stenosis.  At T1-T2, a disc bulge contributes to mild  relative spinal canal narrowing (without spinal cord mass effect).  Reversal of the expected cervical lordosis  Mild cervical levocurvature.   Electronically Signed By: Kellie Simmering D.O. On: 09/03/2021 14:54  Narrative CLINICAL DATA:  Paraspinal left greater than right spasm.  EXAM: CERVICAL SPINE - COMPLETE 4+ VIEW  COMPARISON:  None.  FINDINGS: Slight reversal of the normal cervical lordosis. Slight retrolisthesis of C5 on C6, likely degenerative given degenerative changes at this level. Lateral masses of C1 and C2 are aligned on the open-mouth odontoid view. Mild height loss of the T1 vertebral body anteriorly. Vertebral body heights are maintained. Moderate degenerative disc disease at C5-C6 with disc height loss and endplate spurring. Prevertebral soft tissues are within normal limits.  IMPRESSION: 1. Mild height loss of the T1 vertebral body anteriorly, which could represent degenerative remodeling or age-indeterminant superior endplate fracture. If the patient is focally tender or has a history of recent trauma then a CT or MRI of the thoracic spine is recommended to further evaluate. 2. Moderate degenerative disc disease at C5-C6.   Electronically Signed By: Margaretha Sheffield MD On: 05/20/2021 09:17  Narrative CLINICAL DATA:  Left shoulder pain for the past 3 weeks after moving and lifting.  EXAM: LEFT SHOULDER - 2+ VIEW  COMPARISON:  None.  FINDINGS: There is no evidence of fracture or dislocation. There is no evidence of arthropathy or other focal bone abnormality. Soft tissues are unremarkable.  IMPRESSION: Negative.   Electronically Signed By: Titus Dubin M.D. On: 05/20/2021 09:14  DG Lumbar Spine Complete  Narrative CLINICAL DATA:  Low back pain after bending over yesterday.  EXAM: LUMBAR SPINE - COMPLETE 4+ VIEW  COMPARISON:  None.  FINDINGS: Five lumbar type vertebral bodies.  No acute fracture or subluxation. Vertebral  body heights are preserved.  Alignment is normal.  Mild disc height loss at T11-T12, L3-L4, and L4-L5. Mild right facet arthropathy at L4-L5.  The sacroiliac joints are unremarkable.  IMPRESSION: 1. Mild degenerative disc disease at L3-L4 and L4-L5.   Electronically Signed By: Titus Dubin M.D. On: 05/20/2021 09:15   DG Wrist Complete Left  Narrative CLINICAL DATA:  Left wrist pain.  EXAM: LEFT WRIST - COMPLETE 3+ VIEW  COMPARISON:  None.  FINDINGS: No acute fracture or dislocation. Widening of the scapholunate interval with borderline increased scapholunate angle. Joint spaces are preserved. Bone mineralization is normal. Soft tissues are unremarkable.  IMPRESSION: 1. Findings suggestive of scapholunate ligament injury.   Electronically Signed By: Titus Dubin M.D. On: 05/20/2021 09:18  ROS  Cardiovascular: No reported cardiovascular signs or symptoms such as High blood pressure, coronary artery disease, abnormal heart rate or rhythm, heart attack, blood thinner therapy or heart weakness and/or failure Pulmonary or Respiratory: Snoring  Neurological: No reported neurological signs or symptoms such as seizures, abnormal skin sensations, urinary and/or fecal incontinence, being born with an abnormal open spine and/or a tethered spinal cord Psychological-Psychiatric: No reported psychological or psychiatric signs or symptoms such as difficulty sleeping, anxiety, depression, delusions or hallucinations (schizophrenial), mood swings (bipolar disorders) or suicidal ideations or attempts Gastrointestinal: Reflux or heatburn and Alternating episodes iof diarrhea and constipation (IBS-Irritable bowe syndrome) Genitourinary: Difficulty emptying the bladder or controlling the flow of urine (Neurogenic bladder) Hematological: Weakness due to low blood hemoglobin or red blood cell count (Anemia) Endocrine: No reported endocrine signs or symptoms such as high or low blood  sugar, rapid heart rate due to high thyroid  levels, obesity or weight gain due to slow thyroid or thyroid disease Rheumatologic: No reported rheumatological signs and symptoms such as fatigue, joint pain, tenderness, swelling, redness, heat, stiffness, decreased range of motion, with or without associated rash Musculoskeletal: Negative for myasthenia gravis, muscular dystrophy, multiple sclerosis or malignant hyperthermia Work History: Working full time  Allergies  Charlene Keith is allergic to penicillins.  Laboratory Chemistry Profile   Renal No results found for: BUN, CREATININE, LABCREA, BCR, GFR, GFRAA, GFRNONAA, SPECGRAV, PHUR, PROTEINUR   Electrolytes No results found for: NA, K, CL, CALCIUM, MG, PHOS   Hepatic No results found for: AST, ALT, ALBUMIN, ALKPHOS, AMYLASE, LIPASE, AMMONIA   ID No results found for: LYMEIGGIGMAB, HIV, SARSCOV2NAA, STAPHAUREUS, MRSAPCR, HCVAB, PREGTESTUR, RMSFIGG, QFVRPH1IGG, QFVRPH2IGG, LYMEIGGIGMAB   Bone No results found for: VD25OH, [id number], [id number], [id number], 25OHVITD1, 25OHVITD2, 25OHVITD3, TESTOFREE, TESTOSTERONE   Endocrine No results found for: GLUCOSE, GLUCOSEU, HGBA1C, TSH, FREET4, TESTOFREE, TESTOSTERONE, SHBG, ESTRADIOL, ESTRADIOLPCT, ESTRADIOLFRE, LABPREG, ACTH, CRTSLPL, UCORFRPERLTR, UCORFRPERDAY, CORTISOLBASE, LABPREG   Neuropathy No results found for: VITAMINB12, FOLATE, HGBA1C, HIV   CNS No results found for: COLORCSF, APPEARCSF, RBCCOUNTCSF, WBCCSF, POLYSCSF, LYMPHSCSF, EOSCSF, PROTEINCSF, GLUCCSF, JCVIRUS, CSFOLI, IGGCSF, LABACHR, ACETBL, LABACHR, ACETBL   Inflammation (CRP: Acute   ESR: Chronic) No results found for: CRP, ESRSEDRATE, LATICACIDVEN   Rheumatology No results found for: RF, ANA, LABURIC, URICUR, LYMEIGGIGMAB, LYMEABIGMQN, HLAB27   Coagulation No results found for: INR, LABPROT, APTT, PLT, DDIMER, LABHEMA, VITAMINK1, AT3   Cardiovascular No results found for: BNP, CKTOTAL, CKMB, TROPONINI, HGB, HCT, LABVMA,  EPIRU, EPINEPH24HUR, NOREPRU, NOREPI24HUR, DOPARU, DOPAM24HRUR   Screening No results found for: SARSCOV2NAA, COVIDSOURCE, STAPHAUREUS, MRSAPCR, HCVAB, HIV, PREGTESTUR   Cancer No results found for: CEA, CA125, LABCA2   Allergens No results found for: ALMOND, APPLE, ASPARAGUS, AVOCADO, BANANA, BARLEY, BASIL, BAYLE[place], GREENBEAN, LIMABEAN, WHITEBEAN, BEEFIGE, REDBEET, BLUEBERRY, BROCCOLI, CABBAGE, MELON, CARROT, CASEIN, CASHEWNUT, CAULIFLOWER, CELERY     Note: Lab results reviewed.  PFSH  Drug: Charlene Keith  reports no history of drug use. Alcohol:  reports that she does not currently use alcohol. Tobacco:  reports that she quit smoking about 25 years ago. Her smoking use included cigarettes. She has never used smokeless tobacco. Medical:  has a past medical history of Ovarian cyst. Family: Family history is unknown by patient.  Past Surgical History:  Procedure Laterality Date   ABDOMINAL HYSTERECTOMY     NOSE SURGERY     NOSE SURGERY     WISDOM TOOTH EXTRACTION Bilateral    Active Ambulatory Problems    Diagnosis Date Noted   Chronic idiopathic constipation 11/17/2017   Ovarian mass 03/11/2017   Pain of left sacroiliac joint 05/19/2021   Cervical paraspinal muscle spasm 05/19/2021   Cervical spondylosis with radiculopathy 06/02/2021   Carpal tunnel syndrome of left wrist 06/02/2021   Scapholunate dissociation of left wrist 06/02/2021   Cervical radicular pain 07/11/2021   Chronic pain syndrome 11/20/2021   Cervical disc herniation 11/20/2021   Cervical facet joint syndrome 11/20/2021   Resolved Ambulatory Problems    Diagnosis Date Noted   No Resolved Ambulatory Problems   Past Medical History:  Diagnosis Date   Ovarian cyst    Constitutional Exam  General appearance: Well nourished, well developed, and well hydrated. In no apparent acute distress Vitals:   11/20/21 0903  BP: 116/74  Pulse: 81  Temp: (!) 97.2 F (36.2 C)  SpO2: 98%  Weight: 173 lb (78.5 kg)   Height: 5' 3"  (1.6 m)   BMI Assessment:  Estimated body mass index is 30.65 kg/m as calculated from the following:   Height as of this encounter: 5' 3"  (1.6 m).   Weight as of this encounter: 173 lb (78.5 kg).  BMI interpretation table: BMI level Category Range association with higher incidence of chronic pain  <18 kg/m2 Underweight   18.5-24.9 kg/m2 Ideal body weight   25-29.9 kg/m2 Overweight Increased incidence by 20%  30-34.9 kg/m2 Obese (Class I) Increased incidence by 68%  35-39.9 kg/m2 Severe obesity (Class II) Increased incidence by 136%  >40 kg/m2 Extreme obesity (Class III) Increased incidence by 254%   Patient's current BMI Ideal Body weight  Body mass index is 30.65 kg/m. Ideal body weight: 52.4 kg (115 lb 8.3 oz) Adjusted ideal body weight: 62.8 kg (138 lb 8.2 oz)   BMI Readings from Last 4 Encounters:  11/20/21 30.65 kg/m  09/22/21 31.14 kg/m  08/25/21 30.42 kg/m  07/11/21 30.60 kg/m   Wt Readings from Last 4 Encounters:  11/20/21 173 lb (78.5 kg)  09/22/21 173 lb (78.5 kg)  08/25/21 169 lb (76.7 kg)  07/11/21 170 lb (77.1 kg)    Psych/Mental status: Alert, oriented x 3 (person, place, & time)       Eyes: PERLA Respiratory: No evidence of acute respiratory distress  Cervical Spine Area Exam  Skin & Axial Inspection: No masses, redness, edema, swelling, or associated skin lesions Alignment: Symmetrical Functional ROM: Pain restricted ROM      Stability: No instability detected Muscle Tone/Strength: Functionally intact. No obvious neuro-muscular anomalies detected. Sensory (Neurological): Dermatomal pain pattern Palpation: No palpable anomalies             Positive Spurling's bilaterally Upper Extremity (UE) Exam    Side: Right upper extremity  Side: Left upper extremity  Skin & Extremity Inspection: Skin color, temperature, and hair growth are WNL. No peripheral edema or cyanosis. No masses, redness, swelling, asymmetry, or associated skin lesions.  No contractures.  Skin & Extremity Inspection: Skin color, temperature, and hair growth are WNL. No peripheral edema or cyanosis. No masses, redness, swelling, asymmetry, or associated skin lesions. No contractures.  Functional ROM: Pain restricted ROM          Functional ROM: Pain restricted ROM          Muscle Tone/Strength: Functionally intact. No obvious neuro-muscular anomalies detected.  Muscle Tone/Strength: Functionally intact. No obvious neuro-muscular anomalies detected.  Sensory (Neurological): Dermatomal pain pattern          Sensory (Neurological): Dermatomal pain pattern          Palpation: No palpable anomalies              Palpation: No palpable anomalies              Provocative Test(s):  Phalen's test: deferred Tinel's test: deferred Apley's scratch test (touch opposite shoulder):  Action 1 (Across chest): Decreased ROM Action 2 (Overhead): Decreased ROM Action 3 (LB reach): Decreased ROM   Provocative Test(s):  Phalen's test: deferred Tinel's test: deferred Apley's scratch test (touch opposite shoulder):  Action 1 (Across chest): Decreased ROM Action 2 (Overhead): Decreased ROM Action 3 (LB reach): Decreased ROM    5 out of 5 strength bilateral upper extremity: Shoulder abduction, elbow flexion, elbow extension, thumb extension.   Assessment  Primary Diagnosis & Pertinent Problem List: The primary encounter diagnosis was Cervical radicular pain (L>R). Diagnoses of Cervical disc herniation, Cervical facet joint syndrome, and Chronic pain syndrome were also pertinent to this visit.  Visit  Diagnosis (New problems to examiner): 1. Cervical radicular pain (L>R)   2. Cervical disc herniation   3. Cervical facet joint syndrome   4. Chronic pain syndrome    Plan of Care    At C4-C5, there is moderate disc degeneration. Disc bulge. Superimposed small left center disc protrusion. The disc protrusion results in mild spinal canal narrowing (with contact upon the ventral  spinal cord). No significant foraminal stenosis.  At C5-C6, there is moderate/advanced disc degeneration. Disc bulge with endplate spurring and right greater than left uncovertebral hypertrophy. The disc bulge results in mild relative spinal canal narrowing (with contact upon the ventral spinal cord). Mild/moderate right neural foraminal narrowing.  At C6-C7, there is mild disc degeneration. Possible small central zone posterior annular fissure. Shallow disc bulge. Minimal partial effacement of the ventral thecal sac (without spinal cord mass effect). No significant foraminal stenosis.  At T1-T2, a disc bulge contributes to mild relative spinal canal narrowing (without spinal cord mass effect)  Reviewed the patient's cervical MRI in great detail.  Symptoms, MRI, physical exam consistent with cervical radiculopathy.  Recommended cervical epidural steroid injection.  Risks and benefits reviewed patient like to proceed.  Continue with physical therapy as prescribed.  Continue with conservative therapy including TENS unit and heat.  Recommended pillow that the patient can utilize as well: pillow cube.  Orders Placed This Encounter  Procedures   Cervical Epidural Injection    Sedation: Patient's choice. Purpose: Diagnostic/Therapeutic Indication(s): Radiculitis and cervicalgia associater with cervical degenerative disc disease.    Standing Status:   Future    Standing Expiration Date:   02/18/2022    Scheduling Instructions:     Procedure: Cervical Epidural Steroid Injection/Block     Level(s): C7-T1     LEFT, PO Valium    Order Specific Question:   Where will this procedure be performed?    Answer:   [facility]    Comments:   Daryon Remmert     Procedure Orders         Cervical Epidural Injection      Provider-requested follow-up: Return in about 6 days (around 11/26/2021) for Left C7-T1 ESI, minimal sedation (PO Valium).  Future Appointments  Date Time Provider [place]  11/26/2021  9:40 AM Gillis Santa, MD [facility] None    Note by: Gillis Santa, MD Date: 11/20/2021; Time: 9:52 AM

## 2021-11-20 NOTE — Progress Notes (Signed)
Safety precautions to be maintained throughout the outpatient stay will include: orient to surroundings, keep bed in low position, maintain call bell within reach at all times, provide assistance with transfer out of bed and ambulation.  

## 2021-11-26 ENCOUNTER — Encounter: Payer: Self-pay | Admitting: Student in an Organized Health Care Education/Training Program

## 2021-11-26 ENCOUNTER — Ambulatory Visit
Admission: RE | Admit: 2021-11-26 | Discharge: 2021-11-26 | Disposition: A | Discharge status: Home / Self Care | Payer: BC Managed Care – PPO | Source: Ambulatory Visit | Attending: Student in an Organized Health Care Education/Training Program | Admitting: Student in an Organized Health Care Education/Training Program

## 2021-11-26 ENCOUNTER — Ambulatory Visit (HOSPITAL_BASED_OUTPATIENT_CLINIC_OR_DEPARTMENT_OTHER): Payer: BC Managed Care – PPO | Admitting: Student in an Organized Health Care Education/Training Program

## 2021-11-26 ENCOUNTER — Other Ambulatory Visit: Payer: Self-pay

## 2021-11-26 VITALS — BP 110/71 | HR 77 | Temp 97.1°F | Resp 12 | Ht 62.0 in | Wt 176.0 lb

## 2021-11-26 DIAGNOSIS — M5412 Radiculopathy, cervical region: Secondary | ICD-10-CM | POA: Diagnosis present

## 2021-11-26 DIAGNOSIS — M5020 Other cervical disc displacement, unspecified cervical region: Secondary | ICD-10-CM | POA: Diagnosis present

## 2021-11-26 DIAGNOSIS — G894 Chronic pain syndrome: Secondary | ICD-10-CM | POA: Diagnosis present

## 2021-11-26 MED ORDER — LIDOCAINE HCL 2 % IJ SOLN
20.0000 mL | Freq: Once | INTRAMUSCULAR | Status: AC
  Administered 2021-11-26: 10:00:00 200 mg

## 2021-11-26 MED ORDER — IOHEXOL 180 MG/ML  SOLN
10.0000 mL | Freq: Once | INTRAMUSCULAR | Status: AC
  Administered 2021-11-26: 10:00:00 5 mL via EPIDURAL

## 2021-11-26 MED ORDER — SODIUM CHLORIDE (PF) 0.9 % IJ SOLN
INTRAMUSCULAR | Status: AC
  Filled 2021-11-26: qty 10

## 2021-11-26 MED ORDER — DEXAMETHASONE SODIUM PHOSPHATE 10 MG/ML IJ SOLN
INTRAMUSCULAR | Status: AC
  Filled 2021-11-26: qty 1

## 2021-11-26 MED ORDER — SODIUM CHLORIDE 0.9% FLUSH
1.0000 mL | Freq: Once | INTRAVENOUS | Status: AC
  Administered 2021-11-26: 10:00:00 1 mL

## 2021-11-26 MED ORDER — DEXAMETHASONE SODIUM PHOSPHATE 10 MG/ML IJ SOLN
10.0000 mg | Freq: Once | INTRAMUSCULAR | Status: AC
  Administered 2021-11-26: 10:00:00 10 mg

## 2021-11-26 MED ORDER — ROPIVACAINE HCL 2 MG/ML IJ SOLN
INTRAMUSCULAR | Status: AC
  Filled 2021-11-26: qty 20

## 2021-11-26 MED ORDER — DIAZEPAM 5 MG PO TABS
5.0000 mg | ORAL_TABLET | ORAL | Status: AC
  Administered 2021-11-26: 10:00:00 5 mg via ORAL

## 2021-11-26 MED ORDER — DIAZEPAM 5 MG PO TABS
ORAL_TABLET | ORAL | Status: AC
  Filled 2021-11-26: qty 1

## 2021-11-26 MED ORDER — ROPIVACAINE HCL 2 MG/ML IJ SOLN
1.0000 mL | Freq: Once | INTRAMUSCULAR | Status: AC
  Administered 2021-11-26: 10:00:00 1 mL via EPIDURAL

## 2021-11-26 NOTE — Progress Notes (Signed)
80 Md in to  assess patient. OK to disharge. Patient states she fills a lot better, VS WNL. Eating ice chips.

## 2021-11-26 NOTE — Patient Instructions (Signed)

## 2021-11-26 NOTE — Progress Notes (Signed)
PROVIDER NOTE: Interpretation of information contained herein should be left to medically-trained personnel. Specific patient instructions are provided elsewhere under "Patient Instructions" section of medical record. This document was created in part using STT-dictation technology, any transcriptional errors that may result from this process are unintentional.  Patient: Charlene Keith Type: Established DOB: Apr 06, 1966 MRN: [id number] PCP: Montel Culver, MD  Service: Procedure DOS: 11/26/2021 Setting: Ambulatory Location: Ambulatory outpatient facility Delivery: Face-to-face Provider: Gillis Santa, MD Specialty: Interventional Pain Management Specialty designation: 09 Location: Outpatient facility Ref. Prov.: Montel Culver, MD   Procedure [facility] Interventional Pain Management )   Interlaminar Cervical Epidural Steroid injection (ESI) #1  Laterality: Left Level: C7-T1 Imaging: Fluoroscopy-assisted DOS: 11/26/2021 Performed by: Holley Raring Anesthesia: Local anesthesia (1-2% Lidocaine) Anxiolysis: Oral 5 mg PO Valium Sedation: None.   Purpose: Diagnostic/Therapeutic Indications: Cervicalgia, cervical radicular pain, degenerative disc disease, severe enough to impact quality of life or function. 1. Cervical radicular pain (L>R)   2. Cervical disc herniation   3. Chronic pain syndrome    NAS-11 score:   Pre-procedure: 1 /10   Post-procedure: 0-No pain (putting on clothes and moving neck/shoulder)/10     Pre-Procedure Preparation  Monitoring: As per clinic protocol. Respiration, ETCO2, SpO2, BP, heart rate and rhythm monitor placed and checked for adequate function  Risk Assessment: Vitals:  [id number] body mass index is 32.19 kg/m as calculated from the following:   Height as of this encounter: 5\' 2"  (1.575 m).   Weight as of this encounter: 176 lb (79.8 kg)., Rate:77ECG Heart Rate: 84, BP:130/78, Resp:16, Temp:(!) 97.1 F (36.2 C), SpO2:99 %  Allergies: She is allergic to  penicillins.  Precautions: None required  Blood-thinner(s): None at this time  Coagulopathies: Reviewed. None identified.   Active Infection(s): Reviewed. None identified. Charlene Keith is afebrile   Location setting: Procedure suite Position: Prone, on modified reverse trendelenburg to facilitate breathing, with head in head-cradle. Pillows positioned under chest (below chin-level) with cervical spine flexed. Safety Precautions: Patient was assessed for positional comfort and pressure points before starting the procedure. Prepping solution: DuraPrep (Iodine Povacrylex [0.7% available iodine] and Isopropyl Alcohol, 74% w/w) Prep Area: Entire  cervicothoracic region Approach: percutaneous, paramedial Intended target: Posterior cervical epidural space Materials: Tray: Epidural Needle(s): Epidural (Tuohy) Qty: 1 Length: (54mm) 3.5-inch Gauge: 22G  3 cc solution made of 1 cc of preservative-free saline, 1 cc of 0.2% ropivacaine, 1 cc of Decadron 10 mg/cc.    Meds ordered this encounter  Medications   iohexol (OMNIPAQUE) 180 MG/ML injection 10 mL    Must be Myelogram-compatible. If not available, you may substitute with a water-soluble, non-ionic, hypoallergenic, myelogram-compatible radiological contrast medium.   lidocaine (XYLOCAINE) 2 % (with pres) injection 400 mg   diazepam (VALIUM) tablet 5 mg    Make sure Flumazenil is available in the pyxis when using this medication. If oversedation occurs, administer 0.2 mg IV over 15 sec. If after 45 sec no response, administer 0.2 mg again over 1 min; may repeat at 1 min intervals; not to exceed 4 doses (1 mg)   ropivacaine (PF) 2 mg/mL (0.2%) (NAROPIN) injection 1 mL   sodium chloride flush (NS) 0.9 % injection 1 mL   dexamethasone (DECADRON) injection 10 mg    Orders Placed This Encounter  Procedures   DG PAIN CLINIC C-ARM 1-60 MIN NO REPORT    Intraoperative interpretation by procedural physician at [facility].    Standing  Status:   Standing    Number of Occurrences:  1    Order Specific Question:   Reason for exam:    Answer:   Assistance in needle guidance and placement for procedures requiring needle placement in or near specific anatomical locations not easily accessible without such assistance.     Time-out: [id number] I initiated and conducted the "Time-out" before starting the procedure, as per protocol. The patient was asked to participate by confirming the accuracy of the "Time Out" information. Verification of the correct person, site, and procedure were performed and confirmed by me, the nursing staff, and the patient. "Time-out" conducted as per Joint Commission's Universal Protocol (UP.01.01.01). Procedure checklist: Completed   H&P (Pre-op  Assessment)  Ms. Deal is a 55 y.o. (year old), female patient, seen today for interventional treatment. She  has a past surgical history that includes Abdominal hysterectomy; Nose surgery; Wisdom tooth extraction (Bilateral); and Nose surgery. Charlene Keith has a current medication list which includes the following prescription(s): acetaminophen, cholecalciferol, cyclobenzaprine, duloxetine, estradiol, fluticasone, magnesium, meloxicam, tretinoin, and vitamin b-12. Her primarily concern today is the Neck Pain  She is allergic to penicillins.   Last encounter: My last encounter with her was on 11/20/2021. Pertinent problems: Ms. Heino does not have any pertinent problems on file. Pain Assessment: Severity of Chronic pain is reported as a 1 /10. Location: Neck  /radiates dwon both arms to fingers. Onset: More than a month ago. Quality: Tingling (pinching). Timing: Intermittent. Modifying factor(s): stretching. Vitals:  height is 5\' 2"  (1.575 m) and weight is 176 lb (79.8 kg). Her temperature is 97.1 F (36.2 C) (abnormal). Her blood pressure is 110/71 and her pulse is 77. Her respiration is 12 and oxygen saturation is 100%.   Reason for encounter: Interventional pain  management therapy due pain of at least four (4) weeks in duration, with to failure to respond to and/or inability to tolerate more conservative care.   Related imaging: Cervical MR wo contrast:  Results for orders placed during the hospital encounter of 09/03/21  MR Cervical Spine Wo Contrast  Narrative CLINICAL DATA:  Cervical radiculopathy at C8. Cervical spondylosis. Cervical radiculopathy, no red flags. Additional history provided by scanning technologist: Patient reports neck pain with left shoulder and left arm pain.  EXAM: MRI CERVICAL SPINE WITHOUT CONTRAST  TECHNIQUE: Multiplanar, multisequence MR imaging of the cervical spine was performed. No intravenous contrast was administered.  COMPARISON:  Radiographs of the cervical spine 05/19/2021.  FINDINGS: Alignment: Mild cervical levocurvature. Reversal of the expected cervical lordosis. No significant spondylolisthesis.  Vertebrae: Redemonstrated mild chronic anterior wedge deformity of the T1 vertebral body. Vertebral body height is otherwise maintained. No significant marrow edema or focal suspicious osseous lesion.  Cord: No signal abnormality identified within the cervical spinal cord.  Posterior Fossa, vertebral arteries, paraspinal tissues: No abnormality identified within included portions of the posterior fossa. Flow voids preserved within the imaged cervical vertebral arteries. Paraspinal soft tissues unremarkable.  Disc levels:  Multilevel disc degeneration, greatest at C4-C5 (moderate) and C5-C6 (moderate/advanced).  C2-C3: Minimal facet arthrosis. No significant disc herniation or stenosis.  C3-C4: No significant disc herniation or stenosis.  C4-C5: Disc bulge. Superimposed small left center disc protrusion. The disc protrusion results in mild spinal canal narrowing (with contact upon the ventral spinal cord). No significant foraminal stenosis.  C5-C6: Disc bulge with endplate spurring and  right greater than left uncovertebral hypertrophy. The disc bulge results in mild relative spinal canal narrowing brain (with contact upon the ventral spinal cord). Mild/moderate right neural foraminal narrowing.  C6-C7: Possible  small central zone posterior annular fissure. Shallow disc bulge. Minimal partial effacement of the ventral thecal sac (without spinal cord mass effect). No significant foraminal stenosis.  C7-T1: No significant disc herniation or stenosis.  T1-T2: Imaged sagittally. Disc bulge. Mild partial effacement of the ventral thecal sac (without spinal cord mass effect). No appreciable significant foraminal stenosis.  IMPRESSION: Cervical and upper thoracic spondylosis, as outlined and with findings most notably as follows.  At C4-C5, there is moderate disc degeneration. Disc bulge. Superimposed small left center disc protrusion. The disc protrusion results in mild spinal canal narrowing (with contact upon the ventral spinal cord). No significant foraminal stenosis.  At C5-C6, there is moderate/advanced disc degeneration. Disc bulge with endplate spurring and right greater than left uncovertebral hypertrophy. The disc bulge results in mild relative spinal canal narrowing (with contact upon the ventral spinal cord). Mild/moderate right neural foraminal narrowing.  At C6-C7, there is mild disc degeneration. Possible small central zone posterior annular fissure. Shallow disc bulge. Minimal partial effacement of the ventral thecal sac (without spinal cord mass effect). No significant foraminal stenosis.  At T1-T2, a disc bulge contributes to mild relative spinal canal narrowing (without spinal cord mass effect).  Reversal of the expected cervical lordosis  Mild cervical levocurvature.   Electronically Signed By: Kellie Simmering D.O. On: 09/03/2021 14:54    Narrative CLINICAL DATA:  Paraspinal left greater than right spasm.  EXAM: CERVICAL SPINE -  COMPLETE 4+ VIEW  COMPARISON:  None.  FINDINGS: Slight reversal of the normal cervical lordosis. Slight retrolisthesis of C5 on C6, likely degenerative given degenerative changes at this level. Lateral masses of C1 and C2 are aligned on the open-mouth odontoid view. Mild height loss of the T1 vertebral body anteriorly. Vertebral body heights are maintained. Moderate degenerative disc disease at C5-C6 with disc height loss and endplate spurring. Prevertebral soft tissues are within normal limits.  IMPRESSION: 1. Mild height loss of the T1 vertebral body anteriorly, which could represent degenerative remodeling or age-indeterminant superior endplate fracture. If the patient is focally tender or has a history of recent trauma then a CT or MRI of the thoracic spine is recommended to further evaluate. 2. Moderate degenerative disc disease at C5-C6.   Electronically Signed By: Margaretha Sheffield MD On: 05/20/2021 09:17    Site Confirmation: Ms. Mckenney was asked to confirm the procedure and laterality before marking the site.  Consent: Before the procedure and under the influence of no sedative(s), amnesic(s), or anxiolytics, the patient was informed of the treatment options, risks and possible complications. To fulfill our ethical and legal obligations, as recommended by the American Medical Association's Code of Ethics, I have informed the patient of my clinical impression; the nature and purpose of the treatment or procedure; the risks, benefits, and possible complications of the intervention; the alternatives, including doing nothing; the risk(s) and benefit(s) of the alternative treatment(s) or procedure(s); and the risk(s) and benefit(s) of doing nothing. The patient was provided information about the general risks and possible complications associated with the procedure. These may include, but are not limited to: failure to achieve desired goals, infection, bleeding, organ or nerve  damage, allergic reactions, paralysis, and death. In addition, the patient was informed of those risks and complications associated to Spine-related procedures, such as failure to decrease pain; infection (i.e.: Meningitis, epidural or intraspinal abscess); bleeding (i.e.: epidural hematoma, subarachnoid hemorrhage, or any other type of intraspinal or peri-dural bleeding); organ or nerve damage (i.e.: Any type of peripheral nerve, nerve  root, or spinal cord injury) with subsequent damage to sensory, motor, and/or autonomic systems, resulting in permanent pain, numbness, and/or weakness of one or several areas of the body; allergic reactions; (i.e.: anaphylactic reaction); and/or death. Furthermore, the patient was informed of those risks and complications associated with the medications. These include, but are not limited to: allergic reactions (i.e.: anaphylactic or anaphylactoid reaction(s)); adrenal axis suppression; blood sugar elevation that in diabetics may result in ketoacidosis or comma; water retention that in patients with history of congestive heart failure may result in shortness of breath, pulmonary edema, and decompensation with resultant heart failure; weight gain; swelling or edema; medication-induced neural toxicity; particulate matter embolism and blood vessel occlusion with resultant organ, and/or nervous system infarction; and/or aseptic necrosis of one or more joints. Finally, the patient was informed that Medicine is not an exact science; therefore, there is also the possibility of unforeseen or unpredictable risks and/or possible complications that may result in a catastrophic outcome. The patient indicated having understood very clearly. We have given the patient no guarantees and we have made no promises. Enough time was given to the patient to ask questions, all of which were answered to the patient's satisfaction. Ms. Pavelko has indicated that she wanted to continue with the  procedure. Attestation: I, the ordering provider, attest that I have discussed with the patient the benefits, risks, side-effects, alternatives, likelihood of achieving goals, and potential problems during recovery for the procedure that I have provided informed consent.  Date   Time: 11/26/2021  9:08 AM   Prophylactic antibiotics  Anti-infectives (From admission, onward)    None      Indication(s): None identified   Description of procedure   Start Time: 0956 hrs  Local Anesthesia: Once the patient was positioned, prepped, and time-out was completed. The target area was identified located. The skin was marked with an approved surgical skin marker. Once marked, the skin (epidermis, dermis, and hypodermis), and deeper tissues (fat, connective tissue and muscle) were infiltrated with a small amount of a short-acting local anesthetic, loaded on a 10cc syringe with a 25G, 1.5-in  Needle. An appropriate amount of time was allowed for local anesthetics to take effect before proceeding to the next step. Local Anesthetic: Lidocaine 1-2% The unused portion of the local anesthetic was discarded in the proper designated containers. Safety Precautions: Aspiration looking for blood return was conducted prior to all injections. At no point did I inject any substances, as a needle was being advanced. Before injecting, the patient was told to immediately notify me if she was experiencing any new onset of "ringing in the ears, or metallic taste in the mouth". No attempts were made at seeking any paresthesias. Safe injection practices and needle disposal techniques used. Medications properly checked for expiration dates. SDV (single dose vial) medications used. After the completion of the procedure, all disposable equipment used was discarded in the proper designated medical waste containers.  Technical description: Protocol guidelines were followed. Using fluoroscopic guidance, the epidural needle was  introduced through the skin, ipsilateral to the reported pain, and advanced to the target area. Posterior laminar os was contacted and the needle walked caudad, until the lamina was cleared. The ligamentum flavum was engaged and the epidural space identified using loss-of-resistance technique with 2-3 ml of PF-NaCl (0.9% NSS), in a 5cc dedicated LOR syringe. See "Imaging guidance" below for use of contrast details.  Injection: Once satisfactory needle placement was confirmed, I proceeded to inject the desired solution in slow, incremental  fashion, intermittently assessing for discomfort or any signs of abnormal or undesired spread of substance. Once completed, the needle was removed and disposed of, as per hospital protocols.   Vitals:   11/26/21 1001 11/26/21 1005 11/26/21 1012 11/26/21 1017  BP: 123/83 94/69 112/80 110/71  Pulse:      Resp: 11 12 12    Temp:      SpO2: 98% 94% 100% 100%  Weight:      Height:        End Time: 1005 hrs  Once the entire procedure was completed, the treated area was cleaned, making sure to leave some of the prepping solution back to take advantage of its long term bactericidal properties.   Imaging guidance  Type of Imaging Technique: Fluoroscopy Guidance (Spinal) Indication(s): Assistance in needle guidance and placement for procedures requiring needle placement in or near specific anatomical locations not easily accessible without such assistance. Exposure Time: Please see nurses notes for exact fluoroscopy time. Contrast: Before injecting any contrast, we confirmed that the patient did not have an allergy to iodine, shellfish, or radiological contrast. Once satisfactory needle placement was completed, radiological contrast was injected under continuous fluoroscopic guidance. Injection of contrast accomplished without complications. See chart for type and volume of contrast used. Fluoroscopic Guidance: I was personally present in the fluoroscopy suite, where  the patient was placed in position for the procedure, over the fluoroscopy-compatible table. Fluoroscopy was manipulated, using "Tunnel Vision Technique", to obtain the best possible view of the target area, on the affected side. Parallax error was corrected before commencing the procedure. A "direction-depth-direction" technique was used to introduce the needle under continuous pulsed fluoroscopic guidance. Once the target was reached, antero-posterior, oblique, and lateral fluoroscopic projection views were taken to confirm needle placement in all planes. Electronic images uploaded into EMR.  Interpretation: Successful epidural injection. Intraoperative imaging interpretation by performing Physician.    Post-op assessment  Post-procedure Vital Signs:  Pulse/HCG Rate: 7770 (nsr) Temp: (!) 97.1 F (36.2 C) Resp: 12 BP: 110/71 SpO2: 100 %  EBL: None  Complications: No immediate post-treatment complications observed by team, or reported by patient.  Note: The patient tolerated the entire procedure well. A repeat set of vitals were taken after the procedure and the patient was kept under observation following institutional policy, for this type of procedure. Post-procedural neurological assessment was performed, showing return to baseline, prior to discharge. The patient was provided with post-procedure discharge instructions, including a section on how to identify potential problems. Should any problems arise concerning this procedure, the patient was given instructions to immediately contact us, at any time, without hesitation. In any case, we plan to contact the patient by telephone for a follow-up status report regarding this interventional procedure.  Comments:  No additional relevant information.  5 out of 5 strength bilateral upper extremity: Shoulder abduction, elbow flexion, elbow extension, thumb extension.   Plan of care   Medications administered: We administered iohexol,  lidocaine, diazepam, ropivacaine (PF) 2 mg/mL (0.2%), sodium chloride flush, and dexamethasone.  Follow-up plan:   Return in about 4 weeks (around 12/24/2021) for Post Procedure Evaluation, virtual.     Recent Visits Date Type Provider Dept  11/20/21 Office Visit Gillis Santa, MD [facility]  Showing recent visits within past 90 days and meeting all other requirements Today's Visits Date Type Provider Dept  11/26/21 Procedure visit Gillis Santa, MD [facility]  Showing today's visits and meeting all other requirements Future Appointments Date Type Provider Dept  12/24/21 Appointment Gillis Santa, MD [facility]  Showing future appointments within next 90 days and meeting all other requirements   Disposition: Discharge home  Discharge (Date   Time): 11/26/2021; 1025 hrs.   Primary Care Physician: Montel Culver, MD Location: [facility] Out[facility] Facility Note by: Gillis Santa, MD Date: 11/26/2021; Time: 10:47 AM  DISCLAIMER: Medicine is not an exact science. It has no guarantees or warranties. The decision to proceed with this intervention was based on the information collected from the patient. Conclusions were drawn from the patient's questionnaire, interview, and examination. Because information was provided in large part by the patient, it cannot be guaranteed that it has not been purposely or unconsciously manipulated or altered. Every effort has been made to obtain as much accurate, relevant, available data as possible. Always take into account that the treatment will also be dependent on availability of resources and existing treatment guidelines, considered by other Pain Management Specialists as being common knowledge and practice, at the time of the intervention. It is also important to point out that variation in procedural techniques and pharmacological choices are the acceptable norm. For Medico-Legal review purposes, the  indications, contraindications, technique, and results of the these procedures should only be evaluated, judged and interpreted by a Board-Certified Interventional Pain Specialist with extensive familiarity and expertise in the same exact procedure and technique.

## 2021-11-26 NOTE — Progress Notes (Signed)
Safety precautions to be maintained throughout the outpatient stay will include: orient to surroundings, keep bed in low position, maintain call bell within reach at all times, provide assistance with transfer out of bed and ambulation.  

## 2021-11-27 ENCOUNTER — Telehealth: Payer: Self-pay | Admitting: *Deleted

## 2021-11-27 NOTE — Telephone Encounter (Signed)
Attempted to call for post procedure follow-up. Message left. 

## 2021-12-02 ENCOUNTER — Other Ambulatory Visit: Payer: Self-pay | Admitting: Surgery

## 2021-12-02 DIAGNOSIS — N6092 Unspecified benign mammary dysplasia of left breast: Secondary | ICD-10-CM

## 2021-12-02 DIAGNOSIS — D242 Benign neoplasm of left breast: Secondary | ICD-10-CM

## 2021-12-07 DIAGNOSIS — N6320 Unspecified lump in the left breast, unspecified quadrant: Secondary | ICD-10-CM

## 2021-12-07 HISTORY — DX: Unspecified lump in the left breast, unspecified quadrant: N63.20

## 2021-12-24 ENCOUNTER — Encounter: Payer: Self-pay | Admitting: Student in an Organized Health Care Education/Training Program

## 2021-12-24 ENCOUNTER — Other Ambulatory Visit: Payer: Self-pay

## 2021-12-24 ENCOUNTER — Ambulatory Visit
Payer: BC Managed Care – PPO | Attending: Student in an Organized Health Care Education/Training Program | Admitting: Student in an Organized Health Care Education/Training Program

## 2021-12-24 DIAGNOSIS — G894 Chronic pain syndrome: Secondary | ICD-10-CM | POA: Diagnosis not present

## 2021-12-24 DIAGNOSIS — M5412 Radiculopathy, cervical region: Secondary | ICD-10-CM

## 2021-12-24 DIAGNOSIS — M5020 Other cervical disc displacement, unspecified cervical region: Secondary | ICD-10-CM

## 2021-12-24 NOTE — Progress Notes (Signed)
Patient: Charlene Keith  Service Category: E/M  Provider: Gillis Santa, MD  DOB: [birth year]  DOS: 12/24/2021  Location: Office  MRN: [id number]  Setting: Ambulatory outpatient  Referring Provider: Montel Culver, MD  Type: Established Patient  Specialty: Interventional Pain Management  PCP: Montel Culver, MD  Location: Remote location  Delivery: TeleHealth     Virtual Encounter - Pain Management PROVIDER NOTE: Information contained herein reflects review and annotations entered in association with encounter. Interpretation of such information and data should be left to medically-trained personnel. Information provided to patient can be located elsewhere in the medical record under "Patient Instructions". Document created using STT-dictation technology, any transcriptional errors that may result from process are unintentional.    Contact & Pharmacy Preferred: [phone] Home: [phone] (home) Mobile: [phone] (mobile) E-mail: [email or web address]  [id number] [place], [place] [address] [place] [address] Phone: [phone] Fax: [phone]   Pre-screening  Charlene Keith offered "in-person" vs "virtual" encounter. She indicated preferring virtual for this encounter.   Reason COVID-19*   Social distancing based on CDC and AMA recommendations.   I contacted Charlene Keith on 12/24/2021 via telephone.      I clearly identified myself as Gillis Santa, MD. I verified that I was speaking with the correct person using two identifiers (Name: Charlene Keith, and date of birth: 1966/09/29).  Consent I sought verbal advanced consent from Charlene Keith for virtual visit interactions. I informed Charlene Keith of possible security and privacy concerns, risks, and limitations associated with providing "not-in-person" medical evaluation and management services. I also informed Charlene Keith of the availability of "in-person" appointments. Finally, I  informed her that there would be a charge for the virtual visit and that she could be  personally, fully or partially, financially responsible for it. Charlene Keith expressed understanding and agreed to proceed.   Historic Elements   Charlene Keith is a 56 y.o. year old, female patient evaluated today after our last contact on 11/26/2021. Charlene Keith  has a past medical history of Ovarian cyst. She also  has a past surgical history that includes Abdominal hysterectomy; Nose surgery; Wisdom tooth extraction (Bilateral); and Nose surgery. Charlene Keith has a current medication list which includes the following prescription(s): acetaminophen, cholecalciferol, cyclobenzaprine, estradiol, fluticasone, magnesium, meloxicam, tretinoin, vitamin b-12, and duloxetine. She  reports that she quit smoking about 26 years ago. Her smoking use included cigarettes. She has never used smokeless tobacco. She reports that she does not currently use alcohol. She reports that she does not use drugs. Charlene Keith is allergic to penicillins.   HPI  Today, she is being contacted for a post-procedure assessment.   Post-procedure evaluation    Interlaminar Cervical Epidural Steroid injection (ESI) #1  Laterality: Left Level: C7-T1 Imaging: Fluoroscopy-assisted DOS: 11/26/2021 Performed by: Charlene Keith Anesthesia: Local anesthesia (1-2% Lidocaine) Anxiolysis: Oral 5 mg Keith Valium Sedation: None.   Purpose: Diagnostic/Therapeutic Indications: Cervicalgia, cervical radicular pain, degenerative disc disease, severe enough to impact quality of life or function. 1. Cervical radicular pain (L>R)   2. Cervical disc herniation   3. Chronic pain syndrome    NAS-11 score:   Pre-procedure: 1 /10   Post-procedure: 0-No pain (putting on clothes and moving neck/shoulder)/10     Effectiveness:  Initial hour after procedure: 85 %  Subsequent 4-6 hours post-procedure: 85 %  Analgesia past initial 6 hours: 85 % (pain relief lasted up until  yesterday.  She did a lot of cleaning on yesterday  and she thinks it was too much activity.  continues to have a lot of neck pain and continues to wear her braces for her hands.)  Ongoing improvement:  Analgesic:  60%, pain is returning Function: Charlene Keith reports improvement in function ROM: [name] reports improvement in ROM  Assessment  The primary encounter diagnosis was Cervical radicular pain (L>R). Diagnoses of Cervical disc herniation and Chronic pain syndrome were also pertinent to this visit.  Plan of Care    Ms. Jasani Keith has a current medication list which includes the following long-term medication(s): estradiol and duloxetine.  Positive response to first cervical epidural steroid injection.  Patient is noticing return of her radiating neck and left shoulder pain.  Recommend repeating cervical epidural steroid injection #2, will increase volume.  Orders:  Orders Placed This Encounter  Procedures   Cervical Epidural Injection    Sedation: Patient's choice. Purpose: Diagnostic/Therapeutic Indication(s): Radiculitis and cervicalgia associater with cervical degenerative disc disease.    Standing Status:   Future    Standing Expiration Date:   03/24/2022    Scheduling Instructions:     Procedure: Cervical Epidural Steroid Injection/Block     Level(s): C7-T1     Laterality: TBD     Timeframe: As soon as schedule allows    Order Specific Question:   Where will this procedure be performed?    Answer:   [facility]    Comments:   Charlene Keith   Follow-up plan:   Return in about 4 weeks (around 01/21/2022) for Left C7/T1 ESI #2 , minimal sedation (Keith Valium).    Recent Visits Date Type Provider Dept  11/26/21 Procedure visit Gillis Santa, MD [facility]  11/20/21 Office Visit Gillis Santa, MD [facility]  Showing recent visits within past 90 days and meeting all other requirements Today's Visits Date Type Provider Dept  12/24/21 Office  Visit Gillis Santa, MD [facility]  Showing today's visits and meeting all other requirements Future Appointments No visits were found meeting these conditions. Showing future appointments within next 90 days and meeting all other requirements  I discussed the assessment and treatment plan with the patient. The patient was provided an opportunity to ask questions and all were answered. The patient agreed with the plan and demonstrated an understanding of the instructions.  Patient advised to call back or seek an in-person evaluation if the symptoms or condition worsens.  Duration of encounter: 59minutes.  Note by: Gillis Santa, MD Date: 12/24/2021; Time: 3:22 PM

## 2021-12-25 NOTE — Patient Instructions (Signed)

## 2021-12-29 NOTE — Pre-Procedure Instructions (Signed)
Surgical Instructions    Your procedure is scheduled on Friday, January 27th.  Report to [facility] Main Entrance "A" at 12:30 P.M., then check in with the Admitting office.  Call this number if you have problems the morning of surgery:  [phone]   If you have any questions prior to your surgery date call [phone]: Open Monday-Friday 8am-4pm    Remember:  Do not eat after midnight the night before your surgery  You may drink clear liquids until 11:30 AM the morning of your surgery.   Clear liquids allowed are: Water, Non-Citrus Juices (without pulp), Carbonated Beverages, Clear Tea, Black Coffee Only (NO MILK, CREAM OR POWDERED CREAMER of any kind), and Gatorade.   Patient Instructions  The night before surgery:  No food after midnight. ONLY clear liquids after midnight  The day of surgery (if you do NOT have diabetes):  Drink ONE (1) Pre-Surgery Clear Ensure by 11:30 AM the morning of surgery. Drink in one sitting. Do not sip.  This drink was given to you during your hospital  pre-op appointment visit.  Nothing else to drink after completing the  Pre-Surgery Clear Ensure.         If you have questions, please contact your surgeons office.       Take these medicines the morning of surgery with A SIP OF WATER    If needed: acetaminophen (TYLENOL)  As of today, STOP taking any Aspirin (unless otherwise instructed by your surgeon) Aleve, Naproxen, Ibuprofen, Motrin, Advil, Goody's, BC's, all herbal medications, fish oil, and all vitamins. This includes your meloxicam (MOBIC).                     Do NOT Smoke (Tobacco/Vaping) for 24 hours prior to your procedure.  If you use a CPAP at night, you may bring your mask/headgear for your overnight stay.   Contacts, glasses, piercing's, hearing aid's, dentures or partials may not be worn into surgery, please bring cases for these belongings.    For patients admitted to the hospital, discharge time will be determined  by your treatment team.   Patients discharged the day of surgery will not be allowed to drive home, and someone needs to stay with them for 24 hours.  NO VISITORS WILL BE ALLOWED IN PRE-OP WHERE PATIENTS ARE PREPPED FOR SURGERY.  ONLY 1 SUPPORT PERSON MAY BE PRESENT IN THE WAITING ROOM WHILE YOU ARE IN SURGERY.  IF YOU ARE TO BE ADMITTED, ONCE YOU ARE IN YOUR ROOM YOU WILL BE ALLOWED TWO (2) VISITORS. (1) VISITOR MAY STAY OVERNIGHT BUT MUST ARRIVE TO THE ROOM BY 8pm.  Minor children may have two parents present. Special consideration for safety and communication needs will be reviewed on a case by case basis.   Special instructions:   [place]- Preparing For Surgery  Before surgery, you can play an important role. Because skin is not sterile, your skin needs to be as free of germs as possible. You can reduce the number of germs on your skin by washing with CHG (chlorahexidine gluconate) Soap before surgery.  CHG is an antiseptic cleaner which kills germs and bonds with the skin to continue killing germs even after washing.    Oral Hygiene is also important to reduce your risk of infection.  Remember - BRUSH YOUR TEETH THE MORNING OF SURGERY WITH YOUR REGULAR TOOTHPASTE  Please do not use if you have an allergy to CHG or antibacterial soaps. If your skin becomes reddened/irritated stop  using the CHG.  Do not shave (including legs and underarms) for at least 48 hours prior to first CHG shower. It is OK to shave your face.  Please follow these instructions carefully.   Shower the NIGHT BEFORE SURGERY and the MORNING OF SURGERY  If you chose to wash your hair, wash your hair first as usual with your normal shampoo.  After you shampoo, rinse your hair and body thoroughly to remove the shampoo.  Use CHG Soap as you would any other liquid soap. You can apply CHG directly to the skin and wash gently with a scrungie or a clean washcloth.   Apply the CHG Soap to your body ONLY FROM THE NECK DOWN.   Do not use on open wounds or open sores. Avoid contact with your eyes, ears, mouth and genitals (private parts). Wash Face and genitals (private parts)  with your normal soap.   Wash thoroughly, paying special attention to the area where your surgery will be performed.  Thoroughly rinse your body with warm water from the neck down.  DO NOT shower/wash with your normal soap after using and rinsing off the CHG Soap.  Pat yourself dry with a CLEAN TOWEL.  Wear CLEAN PAJAMAS to bed the night before surgery  Place CLEAN SHEETS on your bed the night before your surgery  DO NOT SLEEP WITH PETS.   Day of Surgery: Shower with CHG soap. Do not wear jewelry, make up, nail polish, gel polish, artificial nails, or any other type of covering on natural nails including finger and toenails. If patients have artificial nails, gel coating, etc. that need to be removed by a nail salon please have this removed prior to surgery. Surgery may need to be canceled/delayed if the surgeon/anesthesiologist feels like the patient is unable to be adequately monitored. Do not wear lotions, powders, perfumes, or deodorant. Do not shave 48 hours prior to surgery.   Do not bring valuables to the hospital. [facility] is not responsible for any belongings or valuables. Wear Clean/Comfortable clothing the morning of surgery Remember to brush your teeth WITH YOUR REGULAR TOOTHPASTE.   Please read over the following fact sheets that you were given.   3 days prior to your procedure    You are not required to quarantine however you are required to wear a well-fitting mask when you are out and around people not in your household. If your mask becomes wet or soiled, replace with a new one.   Wash your hands often with soap and water for 20 seconds or clean your hands with an alcohol-based hand sanitizer that contains at least 60% alcohol.   Do not share personal items.   Notify your provider:  o if you are in  close contact with someone who has COVID  o or if you develop a fever of 100.4 or greater, sneezing, cough, sore throat, shortness of breath or body aches.

## 2021-12-30 ENCOUNTER — Encounter (HOSPITAL_COMMUNITY)
Admission: RE | Admit: 2021-12-30 | Discharge: 2021-12-30 | Disposition: A | Discharge status: Home / Self Care | Payer: BC Managed Care – PPO | Source: Ambulatory Visit | Attending: Surgery | Admitting: Surgery

## 2021-12-30 ENCOUNTER — Encounter (HOSPITAL_COMMUNITY): Payer: Self-pay

## 2021-12-30 ENCOUNTER — Other Ambulatory Visit: Payer: Self-pay

## 2021-12-30 VITALS — BP 166/77 | HR 82 | Temp 97.9°F | Resp 17 | Ht 62.0 in | Wt 176.2 lb

## 2021-12-30 DIAGNOSIS — D242 Benign neoplasm of left breast: Secondary | ICD-10-CM | POA: Diagnosis present

## 2021-12-30 DIAGNOSIS — Z87891 Personal history of nicotine dependence: Secondary | ICD-10-CM | POA: Diagnosis not present

## 2021-12-30 DIAGNOSIS — Z01812 Encounter for preprocedural laboratory examination: Secondary | ICD-10-CM | POA: Insufficient documentation

## 2021-12-30 DIAGNOSIS — Z803 Family history of malignant neoplasm of breast: Secondary | ICD-10-CM | POA: Diagnosis not present

## 2021-12-30 DIAGNOSIS — N6012 Diffuse cystic mastopathy of left breast: Secondary | ICD-10-CM | POA: Diagnosis not present

## 2021-12-30 DIAGNOSIS — N6082 Other benign mammary dysplasias of left breast: Secondary | ICD-10-CM | POA: Diagnosis not present

## 2021-12-30 DIAGNOSIS — N6022 Fibroadenosis of left breast: Secondary | ICD-10-CM | POA: Diagnosis not present

## 2021-12-30 DIAGNOSIS — Z01818 Encounter for other preprocedural examination: Secondary | ICD-10-CM

## 2021-12-30 LAB — CBC
HCT: 40.6 % (ref 36.0–46.0)
Hemoglobin: 13.3 g/dL (ref 12.0–15.0)
MCH: 29 pg (ref 26.0–34.0)
MCHC: 32.8 g/dL (ref 30.0–36.0)
MCV: 88.5 fL (ref 80.0–100.0)
Platelets: 411 10*3/uL — ABNORMAL HIGH (ref 150–400)
RBC: 4.59 MIL/uL (ref 3.87–5.11)
RDW: 13.2 % (ref 11.5–15.5)
WBC: 7.2 10*3/uL (ref 4.0–10.5)
nRBC: 0 % (ref 0.0–0.2)

## 2021-12-30 NOTE — Progress Notes (Signed)
PCP - Dr. Rosette Reveal Cardiologist - denies  PPM/ICD - n/a Device Orders - n/a Rep Notified - n/a  Chest x-ray - n/a EKG - n/a Stress Test - denies ECHO - denies Cardiac Cath - denies  Sleep Study - denies CPAP - n/a  Fasting Blood Sugar - n/a Checks Blood Sugar _____ times a day- n/a  Blood Thinner Instructions: n/a Aspirin Instructions: n/a  ERAS Protcol - Yes PRE-SURGERY Ensure or G2- Ensure  COVID TEST- No. Ambulatory Surgery   Anesthesia review: No  Patient denies shortness of breath, fever, cough and chest pain at PAT appointment   All instructions explained to the patient, with a verbal understanding of the material. Patient agrees to go over the instructions while at home for a better understanding. Patient also instructed to self quarantine after being tested for COVID-19. The opportunity to ask questions was provided.

## 2021-12-30 NOTE — Pre-Procedure Instructions (Signed)
Surgical Instructions    Your procedure is scheduled on Friday, January 27th.  Report to [facility] Main Entrance "A" at 12:30 P.M., then check in with the Admitting office.  Call this number if you have problems the morning of surgery:  [phone]   If you have any questions prior to your surgery date call [phone]: Open Monday-Friday 8am-4pm    Remember:  Do not eat after midnight the night before your surgery  You may drink clear liquids until 11:30 AM the morning of your surgery.   Clear liquids allowed are: Water, Non-Citrus Juices (without pulp), Carbonated Beverages, Clear Tea, Black Coffee Only (NO MILK, CREAM OR POWDERED CREAMER of any kind), and Gatorade.   Patient Instructions  The night before surgery:  No food after midnight. ONLY clear liquids after midnight  The day of surgery (if you do NOT have diabetes):  Drink ONE (1) Pre-Surgery Clear Ensure by 11:30 AM the morning of surgery. Drink in one sitting. Do not sip.  This drink was given to you during your hospital  pre-op appointment visit.  Nothing else to drink after completing the  Pre-Surgery Clear Ensure.         If you have questions, please contact your surgeons office.       Take these medicines the morning of surgery with A SIP OF WATER    If needed: acetaminophen (TYLENOL)  cetirizine (ZYRTEC)   As of today, STOP taking any Aspirin (unless otherwise instructed by your surgeon) Aleve, Naproxen, Ibuprofen, Motrin, Advil, Goody's, BC's, all herbal medications, fish oil, and all vitamins. This includes your meloxicam (MOBIC).                     Do NOT Smoke (Tobacco/Vaping) for 24 hours prior to your procedure.  If you use a CPAP at night, you may bring your mask/headgear for your overnight stay.   Contacts, glasses, piercing's, hearing aid's, dentures or partials may not be worn into surgery, please bring cases for these belongings.    For patients admitted to the hospital, discharge  time will be determined by your treatment team.   Patients discharged the day of surgery will not be allowed to drive home, and someone needs to stay with them for 24 hours.  NO VISITORS WILL BE ALLOWED IN PRE-OP WHERE PATIENTS ARE PREPPED FOR SURGERY.  ONLY 1 SUPPORT PERSON MAY BE PRESENT IN THE WAITING ROOM WHILE YOU ARE IN SURGERY.  IF YOU ARE TO BE ADMITTED, ONCE YOU ARE IN YOUR ROOM YOU WILL BE ALLOWED TWO (2) VISITORS. (1) VISITOR MAY STAY OVERNIGHT BUT MUST ARRIVE TO THE ROOM BY 8pm.  Minor children may have two parents present. Special consideration for safety and communication needs will be reviewed on a case by case basis.   Special instructions:   [place]- Preparing For Surgery  Before surgery, you can play an important role. Because skin is not sterile, your skin needs to be as free of germs as possible. You can reduce the number of germs on your skin by washing with CHG (chlorahexidine gluconate) Soap before surgery.  CHG is an antiseptic cleaner which kills germs and bonds with the skin to continue killing germs even after washing.    Oral Hygiene is also important to reduce your risk of infection.  Remember - BRUSH YOUR TEETH THE MORNING OF SURGERY WITH YOUR REGULAR TOOTHPASTE  Please do not use if you have an allergy to CHG or antibacterial soaps. If your  skin becomes reddened/irritated stop using the CHG.  Do not shave (including legs and underarms) for at least 48 hours prior to first CHG shower. It is OK to shave your face.  Please follow these instructions carefully.   Shower the NIGHT BEFORE SURGERY and the MORNING OF SURGERY  If you chose to wash your hair, wash your hair first as usual with your normal shampoo.  After you shampoo, rinse your hair and body thoroughly to remove the shampoo.  Use CHG Soap as you would any other liquid soap. You can apply CHG directly to the skin and wash gently with a scrungie or a clean washcloth.   Apply the CHG Soap to your body  ONLY FROM THE NECK DOWN.  Do not use on open wounds or open sores. Avoid contact with your eyes, ears, mouth and genitals (private parts). Wash Face and genitals (private parts)  with your normal soap.   Wash thoroughly, paying special attention to the area where your surgery will be performed.  Thoroughly rinse your body with warm water from the neck down.  DO NOT shower/wash with your normal soap after using and rinsing off the CHG Soap.  Pat yourself dry with a CLEAN TOWEL.  Wear CLEAN PAJAMAS to bed the night before surgery  Place CLEAN SHEETS on your bed the night before your surgery  DO NOT SLEEP WITH PETS.   Day of Surgery: Shower with CHG soap. Do not wear jewelry, make up, nail polish, gel polish, artificial nails, or any other type of covering on natural nails including finger and toenails. If patients have artificial nails, gel coating, etc. that need to be removed by a nail salon please have this removed prior to surgery. Surgery may need to be canceled/delayed if the surgeon/anesthesiologist feels like the patient is unable to be adequately monitored. Do not wear lotions, powders, perfumes, or deodorant. Do not shave 48 hours prior to surgery.   Do not bring valuables to the hospital. [facility] is not responsible for any belongings or valuables. Wear Clean/Comfortable clothing the morning of surgery Remember to brush your teeth WITH YOUR REGULAR TOOTHPASTE.   Please read over the following fact sheets that you were given.   3 days prior to your procedure    You are not required to quarantine however you are required to wear a well-fitting mask when you are out and around people not in your household. If your mask becomes wet or soiled, replace with a new one.   Wash your hands often with soap and water for 20 seconds or clean your hands with an alcohol-based hand sanitizer that contains at least 60% alcohol.   Do not share personal items.   Notify your  provider:  o if you are in close contact with someone who has COVID  o or if you develop a fever of 100.4 or greater, sneezing, cough, sore throat, shortness of breath or body aches.

## 2022-01-01 NOTE — H&P (Signed)
REFERRING PHYSICIAN: Claudina Lick, MD  PROVIDER: Beverlee Nims, MD  MRN: [id number] DOB: 07/05/1966  Subjective   Chief Complaint: New Consultation (Left Breast Papilloma )   History of Present Illness: Charlene Keith is a 56 y.o. female who is seen today as an office consultation at the request of Dr. Truman Hayward for evaluation of New Consultation (Left Breast Papilloma ) .   This is a 56 year old female who presented with pain in her left breast. She reports a mass in her left breast for many years. Mammograms of her breast. 6 cm from nipple at 1 o'clock position 7 mm oval mass. 5 cm from nipple at the 8 o'clock position also an 8 mm irregular mass. Both areas were biopsied. The mass at the 1 o'clock position showed a benign fibroadenoma. The mass at the 8 o'clock position showed an intraductal papilloma with atypical ductal hyperplasia. She has had no previous problems regarding her breast. She denies nipple discharge. She has a family history of maternal aunt who was diagnosed with breast cancer in her 56s  Review of Systems: A complete review of systems was obtained from the patient. I have reviewed this information and discussed as appropriate with the patient. See HPI as well for other ROS.  ROS   Medical History: History reviewed. No pertinent past medical history.  Patient Active Problem List  Diagnosis   Carpal tunnel syndrome of left wrist   Cervical disc herniation   Cervical paraspinal muscle spasm   Cervical radicular pain   Cervical facet joint syndrome   Cervical spondylosis with radiculopathy   Chronic idiopathic constipation   Chronic pain syndrome   Ovarian mass   Pain of left sacroiliac joint   Scapholunate dissociation of left wrist   Past Surgical History:  Procedure Laterality Date   HYSTERECTOMY    Allergies  Allergen Reactions   Penicillins Rash   Current Outpatient Medications on File Prior to Visit  Medication Sig Dispense Refill    DULoxetine (CYMBALTA) 30 MG DR capsule Take by mouth   estradioL (CLIMARA) 0.05 mg/24 hr patch Place 1 patch onto the skin every 7 (seven) days   No current facility-administered medications on file prior to visit.   History reviewed. No pertinent family history.   Social History   Tobacco Use  Smoking Status Former   Types: Cigarettes   Quit date: 1999   Years since quitting: 24.0  Smokeless Tobacco Never    Social History   Socioeconomic History   Marital status: Unknown  Tobacco Use   Smoking status: Former  Types: Cigarettes  Quit date: 1999  Years since quitting: 24.0   Smokeless tobacco: Never  Vaping Use   Vaping Use: Unknown  Substance and Sexual Activity   Alcohol use: Not Currently   Drug use: Never   Objective:   Vitals:   BP: 124/72  Pulse: 102  Temp: 36.2 C (97.1 F)  SpO2: 96%  Weight: 78.9 kg (174 lb)  Height: 157.5 cm (5\' 2" )   Body mass index is 31.83 kg/m.  Physical Exam   She appears well on exam.  Progressed from normal in appearance. I cannot palpate any masses in her breast. There is no axillary adenopathy. The nipple areolar complexes are normal.  Lungs clear CV RRR Abdomen soft, NT  Labs, Imaging and Diagnostic Testing: I reviewed her previous, ultrasound, and pathology results  Assessment and Plan:   Diagnoses and all orders for this visit:  Papilloma of left breast  Atypical  ductal hyperplasia of left breast   I have reviewed her mammogram, ultrasound, and pathology results. We discussed the benign findings of the fibroadenoma at the 1 o'clock position of the left breast. We then discussed the papilloma with atypical cells at the 8 o'clock position of the left breast. A radioactive seed guided left breast lumpectomy is recommended to remove the area at the 8 o'clock position of the left breast. As the area of the 1 o'clock position is benign, we do not need to remove this area unless she requested be done. She only wants  the area at the 8 o'clock position removed. I explained the procedure in detail. We discussed the risks which includes but is not limited to bleeding, infection, the need for further procedures if malignancy is found, etc. We also discussed postoperative recovery. Surgery will be scheduled.

## 2022-01-02 ENCOUNTER — Other Ambulatory Visit: Payer: Self-pay

## 2022-01-02 ENCOUNTER — Ambulatory Visit (HOSPITAL_COMMUNITY): Payer: BC Managed Care – PPO | Admitting: Anesthesiology

## 2022-01-02 ENCOUNTER — Ambulatory Visit (HOSPITAL_COMMUNITY)
Admission: RE | Admit: 2022-01-02 | Discharge: 2022-01-02 | Disposition: A | Discharge status: Home / Self Care | Payer: BC Managed Care – PPO | Attending: Surgery | Admitting: Surgery

## 2022-01-02 ENCOUNTER — Encounter (HOSPITAL_COMMUNITY)
Admission: RE | Disposition: A | Discharge status: Home / Self Care | Payer: Self-pay | Source: Home / Self Care | Attending: Surgery

## 2022-01-02 ENCOUNTER — Encounter (HOSPITAL_COMMUNITY): Payer: Self-pay | Admitting: Surgery

## 2022-01-02 DIAGNOSIS — Z87891 Personal history of nicotine dependence: Secondary | ICD-10-CM | POA: Insufficient documentation

## 2022-01-02 DIAGNOSIS — N6012 Diffuse cystic mastopathy of left breast: Secondary | ICD-10-CM | POA: Insufficient documentation

## 2022-01-02 DIAGNOSIS — N6022 Fibroadenosis of left breast: Secondary | ICD-10-CM | POA: Insufficient documentation

## 2022-01-02 DIAGNOSIS — D242 Benign neoplasm of left breast: Secondary | ICD-10-CM | POA: Insufficient documentation

## 2022-01-02 DIAGNOSIS — Z803 Family history of malignant neoplasm of breast: Secondary | ICD-10-CM | POA: Insufficient documentation

## 2022-01-02 DIAGNOSIS — N6082 Other benign mammary dysplasias of left breast: Secondary | ICD-10-CM | POA: Insufficient documentation

## 2022-01-02 HISTORY — PX: BREAST LUMPECTOMY WITH RADIOACTIVE SEED LOCALIZATION: SHX6424

## 2022-01-02 SURGERY — BREAST LUMPECTOMY WITH RADIOACTIVE SEED LOCALIZATION
Anesthesia: General | Site: Breast | Laterality: Left

## 2022-01-02 MED ORDER — CIPROFLOXACIN IN D5W 400 MG/200ML IV SOLN
400.0000 mg | INTRAVENOUS | Status: AC
  Administered 2022-01-02: 400 mg via INTRAVENOUS

## 2022-01-02 MED ORDER — ONDANSETRON HCL 4 MG/2ML IJ SOLN
INTRAMUSCULAR | Status: AC
  Filled 2022-01-02: qty 8

## 2022-01-02 MED ORDER — CHLORHEXIDINE GLUCONATE 0.12 % MT SOLN: 15.0000 mL | Freq: Once | OROMUCOSAL | Status: AC

## 2022-01-02 MED ORDER — CHLORHEXIDINE GLUCONATE CLOTH 2 % EX PADS: 6.0000 | MEDICATED_PAD | Freq: Once | CUTANEOUS | Status: DC

## 2022-01-02 MED ORDER — ONDANSETRON HCL 4 MG/2ML IJ SOLN
INTRAMUSCULAR | Status: DC | PRN
  Administered 2022-01-02: 4 mg via INTRAVENOUS

## 2022-01-02 MED ORDER — BUPIVACAINE-EPINEPHRINE (PF) 0.25% -1:200000 IJ SOLN
INTRAMUSCULAR | Status: AC
  Filled 2022-01-02: qty 30

## 2022-01-02 MED ORDER — FENTANYL CITRATE (PF) 250 MCG/5ML IJ SOLN
INTRAMUSCULAR | Status: DC | PRN
Start: 2022-01-02 — End: 2022-01-02
  Administered 2022-01-02 (×2): 50 ug via INTRAVENOUS

## 2022-01-02 MED ORDER — CHLORHEXIDINE GLUCONATE 0.12 % MT SOLN
OROMUCOSAL | Status: AC
  Administered 2022-01-02: 15 mL via OROMUCOSAL
  Filled 2022-01-02: qty 15

## 2022-01-02 MED ORDER — FENTANYL CITRATE (PF) 100 MCG/2ML IJ SOLN
INTRAMUSCULAR | Status: AC
  Filled 2022-01-02: qty 2

## 2022-01-02 MED ORDER — DEXAMETHASONE SODIUM PHOSPHATE 10 MG/ML IJ SOLN
INTRAMUSCULAR | Status: AC
  Filled 2022-01-02: qty 4

## 2022-01-02 MED ORDER — FENTANYL CITRATE (PF) 100 MCG/2ML IJ SOLN
25.0000 ug | INTRAMUSCULAR | Status: DC | PRN
  Administered 2022-01-02: 50 ug via INTRAVENOUS
  Administered 2022-01-02: 25 ug via INTRAVENOUS

## 2022-01-02 MED ORDER — TRAMADOL HCL 50 MG PO TABS: 50.0000 mg | ORAL_TABLET | Freq: Four times a day (QID) | ORAL | 0 refills | Status: DC | PRN

## 2022-01-02 MED ORDER — LIDOCAINE 2% (20 MG/ML) 5 ML SYRINGE
INTRAMUSCULAR | Status: DC | PRN
  Administered 2022-01-02: 60 mg via INTRAVENOUS

## 2022-01-02 MED ORDER — PROPOFOL 10 MG/ML IV BOLUS
INTRAVENOUS | Status: AC
  Filled 2022-01-02: qty 20

## 2022-01-02 MED ORDER — ACETAMINOPHEN 500 MG PO TABS
ORAL_TABLET | ORAL | Status: AC
  Administered 2022-01-02: 1000 mg via ORAL
  Filled 2022-01-02: qty 2

## 2022-01-02 MED ORDER — MIDAZOLAM HCL 2 MG/2ML IJ SOLN
INTRAMUSCULAR | Status: AC
  Filled 2022-01-02: qty 2

## 2022-01-02 MED ORDER — LACTATED RINGERS IV SOLN: INTRAVENOUS | Status: DC

## 2022-01-02 MED ORDER — PHENYLEPHRINE 40 MCG/ML (10ML) SYRINGE FOR IV PUSH (FOR BLOOD PRESSURE SUPPORT)
PREFILLED_SYRINGE | INTRAVENOUS | Status: DC | PRN
  Administered 2022-01-02 (×2): 120 ug via INTRAVENOUS

## 2022-01-02 MED ORDER — MIDAZOLAM HCL 2 MG/2ML IJ SOLN
INTRAMUSCULAR | Status: DC | PRN
  Administered 2022-01-02: 2 mg via INTRAVENOUS

## 2022-01-02 MED ORDER — ORAL CARE MOUTH RINSE: 15.0000 mL | Freq: Once | OROMUCOSAL | Status: AC

## 2022-01-02 MED ORDER — CIPROFLOXACIN IN D5W 400 MG/200ML IV SOLN
INTRAVENOUS | Status: AC
  Filled 2022-01-02: qty 200

## 2022-01-02 MED ORDER — SUCCINYLCHOLINE CHLORIDE 200 MG/10ML IV SOSY
PREFILLED_SYRINGE | INTRAVENOUS | Status: AC
  Filled 2022-01-02: qty 10

## 2022-01-02 MED ORDER — ENSURE PRE-SURGERY PO LIQD: 296.0000 mL | Freq: Once | ORAL | Status: DC

## 2022-01-02 MED ORDER — ALBUTEROL SULFATE HFA 108 (90 BASE) MCG/ACT IN AERS
INHALATION_SPRAY | RESPIRATORY_TRACT | Status: AC
  Filled 2022-01-02: qty 6.7

## 2022-01-02 MED ORDER — FENTANYL CITRATE (PF) 250 MCG/5ML IJ SOLN
INTRAMUSCULAR | Status: AC
  Filled 2022-01-02: qty 5

## 2022-01-02 MED ORDER — PROPOFOL 10 MG/ML IV BOLUS
INTRAVENOUS | Status: DC | PRN
  Administered 2022-01-02: 150 mg via INTRAVENOUS

## 2022-01-02 MED ORDER — 0.9 % SODIUM CHLORIDE (POUR BTL) OPTIME
TOPICAL | Status: DC | PRN
  Administered 2022-01-02: 1000 mL

## 2022-01-02 MED ORDER — PHENYLEPHRINE 40 MCG/ML (10ML) SYRINGE FOR IV PUSH (FOR BLOOD PRESSURE SUPPORT)
PREFILLED_SYRINGE | INTRAVENOUS | Status: AC
  Filled 2022-01-02: qty 40

## 2022-01-02 MED ORDER — ACETAMINOPHEN 500 MG PO TABS: 1000.0000 mg | ORAL_TABLET | Freq: Once | ORAL | Status: AC

## 2022-01-02 MED ORDER — LIDOCAINE 2% (20 MG/ML) 5 ML SYRINGE
INTRAMUSCULAR | Status: AC
  Filled 2022-01-02: qty 20

## 2022-01-02 MED ORDER — DEXAMETHASONE SODIUM PHOSPHATE 10 MG/ML IJ SOLN
INTRAMUSCULAR | Status: DC | PRN
  Administered 2022-01-02: 10 mg via INTRAVENOUS

## 2022-01-02 MED ORDER — ROCURONIUM BROMIDE 10 MG/ML (PF) SYRINGE
PREFILLED_SYRINGE | INTRAVENOUS | Status: AC
  Filled 2022-01-02: qty 20

## 2022-01-02 MED ORDER — BUPIVACAINE-EPINEPHRINE 0.25% -1:200000 IJ SOLN
INTRAMUSCULAR | Status: DC | PRN
  Administered 2022-01-02: 20 mL

## 2022-01-02 SURGICAL SUPPLY — 36 items
ADH SKN CLS APL DERMABOND .7 (GAUZE/BANDAGES/DRESSINGS) ×1
APL PRP STRL LF DISP 70% ISPRP (MISCELLANEOUS) ×1
APPLIER CLIP 9.375 MED OPEN (MISCELLANEOUS) ×2
APR CLP MED 9.3 20 MLT OPN (MISCELLANEOUS) ×1
BAG COUNTER SPONGE SURGICOUNT (BAG) ×2 IMPLANT
BAG SPNG CNTER NS LX DISP (BAG) ×1
BINDER BREAST LRG (GAUZE/BANDAGES/DRESSINGS) IMPLANT
BINDER BREAST XLRG (GAUZE/BANDAGES/DRESSINGS) IMPLANT
CANISTER SUCT 3000ML PPV (MISCELLANEOUS) ×2 IMPLANT
CHLORAPREP W/TINT 26 (MISCELLANEOUS) ×2 IMPLANT
CLIP APPLIE 9.375 MED OPEN (MISCELLANEOUS) ×1 IMPLANT
COVER PROBE W GEL 5X96 (DRAPES) ×2 IMPLANT
COVER SURGICAL LIGHT HANDLE (MISCELLANEOUS) ×2 IMPLANT
DERMABOND ADVANCED (GAUZE/BANDAGES/DRESSINGS) ×1
DERMABOND ADVANCED .7 DNX12 (GAUZE/BANDAGES/DRESSINGS) ×1 IMPLANT
DEVICE DUBIN SPECIMEN MAMMOGRA (MISCELLANEOUS) ×2 IMPLANT
DRAPE CHEST BREAST 15X10 FENES (DRAPES) ×2 IMPLANT
ELECT CAUTERY BLADE 6.4 (BLADE) ×2 IMPLANT
ELECT REM PT RETURN 9FT ADLT (ELECTROSURGICAL) ×2
ELECTRODE REM PT RTRN 9FT ADLT (ELECTROSURGICAL) ×1 IMPLANT
GLOVE SURG SIGNA 7.5 PF LTX (GLOVE) ×2 IMPLANT
GOWN STRL REUS W/ TWL LRG LVL3 (GOWN DISPOSABLE) ×1 IMPLANT
GOWN STRL REUS W/ TWL XL LVL3 (GOWN DISPOSABLE) ×1 IMPLANT
GOWN STRL REUS W/TWL LRG LVL3 (GOWN DISPOSABLE) ×2
GOWN STRL REUS W/TWL XL LVL3 (GOWN DISPOSABLE) ×2
KIT BASIN OR (CUSTOM PROCEDURE TRAY) ×2 IMPLANT
KIT MARKER MARGIN INK (KITS) ×2 IMPLANT
NDL HYPO 25GX1X1/2 BEV (NEEDLE) ×1 IMPLANT
NEEDLE HYPO 25GX1X1/2 BEV (NEEDLE) ×2 IMPLANT
NS IRRIG 1000ML POUR BTL (IV SOLUTION) IMPLANT
PACK GENERAL/GYN (CUSTOM PROCEDURE TRAY) ×2 IMPLANT
SUT MNCRL AB 4-0 PS2 18 (SUTURE) ×2 IMPLANT
SUT VIC AB 3-0 SH 18 (SUTURE) ×2 IMPLANT
SYR CONTROL 10ML LL (SYRINGE) ×2 IMPLANT
TOWEL GREEN STERILE (TOWEL DISPOSABLE) ×2 IMPLANT
TOWEL GREEN STERILE FF (TOWEL DISPOSABLE) ×2 IMPLANT

## 2022-01-02 NOTE — Transfer of Care (Signed)
Immediate Anesthesia Transfer of Care Note  Patient: Charlene Keith  Procedure(s) Performed: LEFT BREAST LUMPECTOMY WITH RADIOACTIVE SEED LOCALIZATION (Left: Breast)  Patient Location: PACU  Anesthesia Type:General  Level of Consciousness: awake, alert  and oriented  Airway & Oxygen Therapy: Patient Spontanous Breathing  Post-op Assessment: Report given to RN and Post -op Vital signs reviewed and stable  Post vital signs: Reviewed and stable  Last Vitals:  Vitals Value Taken Time  BP 119/67 01/02/22 1508  Temp    Pulse 102 01/02/22 1510  Resp 15 01/02/22 1510  SpO2 99 % 01/02/22 1510  Vitals shown include unvalidated device data.  Last Pain:  Vitals:   01/02/22 1307  TempSrc:   PainSc: 0-No pain      Patients Stated Pain Goal: 0 (33/35/45 6256)  Complications: No notable events documented.

## 2022-01-02 NOTE — Op Note (Signed)
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  The decision was made to proceed with a radioactive seed guided left breast lumpectomy  Procedure: The patient was brought to operating identifies a correct patient.  She is placed upon the operating table general anesthesia was induced.  Her left breast was prepped and draped in usual sterile fashion.  The radioactive seed was located with the aid of neoprobe with a o'clock position several inches from the nipple areolar complex.  I anesthetized the medial edge of the complex with Marcaine and then made a circumareolar incision with a scalpel.  I then dissected down to the breast tissue with the cautery.  With the aid of neoprobe I then started dissecting medially tolerates the area of the radioactive seed.  I grasped the breast tissue with an Allis clamp and then performed a lumpectomy staying around the radioactive seed with aid of the neoprobe.  Once it was removed I marked the margins with paint.  An x-ray was performed confirming the radioactive seed and previous biopsy clip were in the specimen.  The specimen was then sent to pathology for evaluation.  I achieved hemostasis with the cautery.  I then anesthetized the incision further with Marcaine.  I then closed the subcutaneous tissue with interrupted 3-0 Vicryl sutures  and closed the skin with a running 4-0 Monocryl.  Dermabond was then applied.  The patient tolerated the procedure well.  All the counts were correct at the end of the procedure.  The patient was then extubated in the operating room and taken in a stable condition to the recovery room.          [name]   Date: 01/02/2022  Time: 3:00 PM

## 2022-01-02 NOTE — Discharge Instructions (Signed)
[facility] Office Phone Number [phone]  BREAST BIOPSY/ PARTIAL MASTECTOMY: POST OP INSTRUCTIONS  Always review your discharge instruction sheet given to you by the facility where your surgery was performed.  IF YOU HAVE DISABILITY OR FAMILY LEAVE FORMS, YOU MUST BRING THEM TO THE OFFICE FOR PROCESSING.  DO NOT GIVE THEM TO YOUR DOCTOR.  A prescription for pain medication may be given to you upon discharge.  Take your pain medication as prescribed, if needed.  If narcotic pain medicine is not needed, then you may take acetaminophen (Tylenol) or ibuprofen (Advil) as needed. Take your usually prescribed medications unless otherwise directed If you need a refill on your pain medication, please contact your pharmacy.  They will contact our office to request authorization.  Prescriptions will not be filled after 5pm or on week-ends. You should eat very light the first 24 hours after surgery, such as soup, crackers, pudding, etc.  Resume your normal diet the day after surgery. Most patients will experience some swelling and bruising in the breast.  Ice packs and a good support bra will help.  Swelling and bruising can take several days to resolve.  It is common to experience some constipation if taking pain medication after surgery.  Increasing fluid intake and taking a stool softener will usually help or prevent this problem from occurring.  A mild laxative (Milk of Magnesia or Miralax) should be taken according to package directions if there are no bowel movements after 48 hours. Unless discharge instructions indicate otherwise, you may remove your bandages 24-48 hours after surgery, and you may shower at that time.  You may have steri-strips (small skin tapes) in place directly over the incision.  These strips should be left on the skin for 7-10 days.  If your surgeon used skin glue on the incision, you may shower in 24 hours.  The glue will flake off over the next 2-3 weeks.  Any  sutures or staples will be removed at the office during your follow-up visit. ACTIVITIES:  You may resume regular daily activities (gradually increasing) beginning the next day.  Wearing a good support bra or sports bra minimizes pain and swelling.  You may have sexual intercourse when it is comfortable. You may drive when you no longer are taking prescription pain medication, you can comfortably wear a seatbelt, and you can safely maneuver your car and apply brakes. RETURN TO WORK:  ______________________________________________________________________________________ You should see your doctor in the office for a follow-up appointment approximately two weeks after your surgery.  Your doctor's nurse will typically make your follow-up appointment when she calls you with your pathology report.  Expect your pathology report 2-3 business days after your surgery.  You may call to check if you do not hear from us after three days. OTHER INSTRUCTIONS: OK TO SHOWER STARTING TOMORROW ICE PACK, TYLENOL, AND IBUPROFEN ALSO FOR PAIN NO VIGOROUS ACTIVITY FOR ONE WEEK _______________________________________________________________________________________________ _____________________________________________________________________________________________________________________________________ _____________________________________________________________________________________________________________________________________ _____________________________________________________________________________________________________________________________________  WHEN TO CALL YOUR DOCTOR: Fever over 101.0 Nausea and/or vomiting. Extreme swelling or bruising. Continued bleeding from incision. Increased pain, redness, or drainage from the incision.  The clinic staff is available to answer your questions during regular business hours.  Please don't hesitate to call and ask to speak to one of the nurses for clinical  concerns.  If you have a medical emergency, go to the nearest emergency room or call 911.  A surgeon from [facility] is always on call at the hospital.  For further questions, please visit   [email or web address]   

## 2022-01-02 NOTE — Anesthesia Preprocedure Evaluation (Addendum)
Anesthesia Evaluation  Patient identified by MRN, date of birth, ID band Patient awake    Reviewed: Allergy & Precautions, Patient's Chart, lab work & pertinent test results  Airway Mallampati: I  TM Distance: >3 FB Neck ROM: Full    Dental no notable dental hx.    Pulmonary neg pulmonary ROS, former smoker,    Pulmonary exam normal        Cardiovascular negative cardio ROS   Rhythm:Regular Rate:Normal     Neuro/Psych negative neurological ROS  negative psych ROS   GI/Hepatic negative GI ROS, Neg liver ROS,   Endo/Other  negative endocrine ROS  Renal/GU negative Renal ROS  negative genitourinary   Musculoskeletal  (+) Arthritis ,   Abdominal Normal abdominal exam  (+)   Peds  Hematology negative hematology ROS (+)   Anesthesia Other Findings Left breast papilloma  Reproductive/Obstetrics                            Anesthesia Physical Anesthesia Plan  ASA: 2  Anesthesia Plan: General   Post-op Pain Management: Tylenol PO (pre-op)   Induction: Intravenous  PONV Risk Score and Plan: 3 and Ondansetron, Dexamethasone and Midazolam  Airway Management Planned: LMA and Mask  Additional Equipment:   Intra-op Plan:   Post-operative Plan: Extubation in OR  Informed Consent: I have reviewed the patients History and Physical, chart, labs and discussed the procedure including the risks, benefits and alternatives for the proposed anesthesia with the patient or authorized representative who has indicated his/her understanding and acceptance.     Dental advisory given  Plan Discussed with: CRNA  Anesthesia Plan Comments:        Anesthesia Quick Evaluation

## 2022-01-02 NOTE — Anesthesia Procedure Notes (Signed)
Procedure Name: LMA Insertion Date/Time: 01/02/2022 2:25 PM Performed by: Griffin Dakin, CRNA Pre-anesthesia Checklist: Patient identified, Emergency Drugs available, Patient being monitored, Suction available and Timeout performed Patient Re-evaluated:Patient Re-evaluated prior to induction Oxygen Delivery Method: Circle system utilized Preoxygenation: Pre-oxygenation with 100% oxygen Induction Type: IV induction Ventilation: Mask ventilation without difficulty LMA: LMA inserted LMA Size: 4.0 Placement Confirmation: positive ETCO2 and breath sounds checked- equal and bilateral Tube secured with: Tape Dental Injury: Teeth and Oropharynx as per pre-operative assessment

## 2022-01-02 NOTE — Interval H&P Note (Signed)
History and Physical Interval Note: no change in H and P  01/02/2022 1:44 PM  Charlene Keith  has presented today for surgery, with the diagnosis of LEFT BREAST PAPILLOMA WITH ADH.  The various methods of treatment have been discussed with the patient and family. After consideration of risks, benefits and other options for treatment, the patient has consented to  Procedure(s): LEFT BREAST LUMPECTOMY WITH RADIOACTIVE SEED LOCALIZATION (Left) as a surgical intervention.  The patient's history has been reviewed, patient examined, no change in status, stable for surgery.  I have reviewed the patient's chart and labs.  Questions were answered to the patient's satisfaction.     [name]

## 2022-01-03 NOTE — Anesthesia Postprocedure Evaluation (Signed)
Anesthesia Post Note  Patient: Charlene Keith  Procedure(s) Performed: LEFT BREAST LUMPECTOMY WITH RADIOACTIVE SEED LOCALIZATION (Left: Breast)     Patient location during evaluation: PACU Anesthesia Type: General Level of consciousness: sedated and patient cooperative Pain management: pain level controlled Vital Signs Assessment: post-procedure vital signs reviewed and stable Respiratory status: spontaneous breathing Cardiovascular status: stable Anesthetic complications: no   No notable events documented.  Last Vitals:  Vitals:   01/02/22 1530 01/02/22 1542  BP: 113/68   Pulse: 84 98  Resp: 13   Temp:    SpO2: 99%     Last Pain:  Vitals:   01/02/22 1518  TempSrc:   PainSc: [place]

## 2022-01-04 ENCOUNTER — Encounter (HOSPITAL_COMMUNITY): Payer: Self-pay | Admitting: Surgery

## 2022-01-05 LAB — SURGICAL PATHOLOGY

## 2022-01-06 ENCOUNTER — Telehealth: Payer: Self-pay

## 2022-01-06 NOTE — Telephone Encounter (Signed)
Please schedule 40 min CPE at patient's convenience.  If she comes in the AM please advise to fast so we can obtain labs.

## 2022-01-21 ENCOUNTER — Encounter: Payer: Self-pay | Admitting: Student in an Organized Health Care Education/Training Program

## 2022-01-21 ENCOUNTER — Other Ambulatory Visit: Payer: Self-pay

## 2022-01-21 ENCOUNTER — Ambulatory Visit (HOSPITAL_BASED_OUTPATIENT_CLINIC_OR_DEPARTMENT_OTHER): Payer: BC Managed Care – PPO | Admitting: Student in an Organized Health Care Education/Training Program

## 2022-01-21 ENCOUNTER — Ambulatory Visit
Admission: RE | Admit: 2022-01-21 | Discharge: 2022-01-21 | Disposition: A | Discharge status: Home / Self Care | Payer: BC Managed Care – PPO | Source: Ambulatory Visit | Attending: Student in an Organized Health Care Education/Training Program | Admitting: Student in an Organized Health Care Education/Training Program

## 2022-01-21 VITALS — BP 118/83 | HR 81 | Temp 98.2°F | Resp 10 | Ht 65.0 in | Wt 176.0 lb

## 2022-01-21 DIAGNOSIS — G894 Chronic pain syndrome: Secondary | ICD-10-CM

## 2022-01-21 DIAGNOSIS — M5412 Radiculopathy, cervical region: Secondary | ICD-10-CM | POA: Diagnosis not present

## 2022-01-21 DIAGNOSIS — M5020 Other cervical disc displacement, unspecified cervical region: Secondary | ICD-10-CM | POA: Diagnosis not present

## 2022-01-21 MED ORDER — ROPIVACAINE HCL 2 MG/ML IJ SOLN
1.0000 mL | Freq: Once | INTRAMUSCULAR | Status: AC
  Administered 2022-01-21: 20 mL via EPIDURAL

## 2022-01-21 MED ORDER — IOHEXOL 180 MG/ML  SOLN
10.0000 mL | Freq: Once | INTRAMUSCULAR | Status: AC
  Administered 2022-01-21: 5 mL via EPIDURAL
  Filled 2022-01-21: qty 20

## 2022-01-21 MED ORDER — DEXAMETHASONE SODIUM PHOSPHATE 10 MG/ML IJ SOLN
10.0000 mg | Freq: Once | INTRAMUSCULAR | Status: AC
  Administered 2022-01-21: 10 mg
  Filled 2022-01-21: qty 1

## 2022-01-21 MED ORDER — LIDOCAINE HCL 2 % IJ SOLN
20.0000 mL | Freq: Once | INTRAMUSCULAR | Status: AC
  Administered 2022-01-21: 400 mg
  Filled 2022-01-21: qty 20

## 2022-01-21 MED ORDER — SODIUM CHLORIDE 0.9% FLUSH
1.0000 mL | Freq: Once | INTRAVENOUS | Status: AC
  Administered 2022-01-21: 10 mL

## 2022-01-21 MED ORDER — DIAZEPAM 5 MG PO TABS
ORAL_TABLET | ORAL | Status: AC
  Filled 2022-01-21: qty 1

## 2022-01-21 MED ORDER — DIAZEPAM 5 MG PO TABS
5.0000 mg | ORAL_TABLET | ORAL | Status: AC
  Administered 2022-01-21: 5 mg via ORAL

## 2022-01-21 MED ORDER — ROPIVACAINE HCL 2 MG/ML IJ SOLN
INTRAMUSCULAR | Status: AC
  Filled 2022-01-21: qty 20

## 2022-01-21 NOTE — Progress Notes (Signed)
Safety precautions to be maintained throughout the outpatient stay will include: orient to surroundings, keep bed in low position, maintain call bell within reach at all times, provide assistance with transfer out of bed and ambulation.  

## 2022-01-21 NOTE — Progress Notes (Deleted)
PROVIDER NOTE: Interpretation of information contained herein should be left to medically-trained personnel. Specific patient instructions are provided elsewhere under "Patient Instructions" section of medical record. This document was created in part using STT-dictation technology, any transcriptional errors that may result from this process are unintentional.  Patient: Charlene Keith Type: Established DOB: 05/25/1966 MRN: [id number] PCP: Montel Culver, MD  Service: Procedure DOS: 01/21/2022 Setting: Ambulatory Location: Ambulatory outpatient facility Delivery: Face-to-face Provider: Gillis Santa, MD Specialty: Interventional Pain Management Specialty designation: 09 Location: Outpatient facility Ref. Prov.: Montel Culver, MD   Procedure [facility] Interventional Pain Management )   Type: Cervical Epidural Steroid injection (ESI) (Interlaminar) #1  Laterality: Left  Level: C7-T1 Imaging: Fluoroscopy-assisted DOS: 01/21/2022  Performed by: Gillis Santa, MD Anesthesia: Local anesthesia (1-2% Lidocaine) Anxiolysis: Oral Valium 5 mg Sedation: None.   Purpose: Diagnostic/Therapeutic Indications: Cervicalgia, cervical radicular pain, degenerative disc disease, severe enough to impact quality of life or function. 1. Cervical radicular pain (L>R)   2. Cervical disc herniation   3. Chronic pain syndrome    NAS-11 score:   Pre-procedure:  /10   Post-procedure:  /10     Pre-Procedure Preparation  Monitoring: As per clinic protocol. Respiration, ETCO2, SpO2, BP, heart rate and rhythm monitor placed and checked for adequate function  Risk Assessment: Vitals:  VZD:[id number] body mass index is 32.23 kg/m as calculated from the following:   Height as of 01/02/22: 5\' 2"  (1.575 m).   Weight as of 01/02/22: 176 lb 3.2 oz (79.9 kg)., Rate:  , BP: , Resp: , Temp: , SpO2:   Allergies: She is allergic to penicillins.  Precautions: None required  Blood-thinner(s): None at this time   Coagulopathies: Reviewed. None identified.   Active Infection(s): Reviewed. None identified. Charlene Keith is afebrile   Location setting: Procedure suite Position: Prone, on modified reverse trendelenburg to facilitate breathing, with head in head-cradle. Pillows positioned under chest (below chin-level) with cervical spine flexed. Safety Precautions: Patient was assessed for positional comfort and pressure points before starting the procedure. Prepping solution: DuraPrep (Iodine Povacrylex [0.7% available iodine] and Isopropyl Alcohol, 74% w/w) Prep Area: Entire  cervicothoracic region Approach: percutaneous, paramedial Intended target: Posterior cervical epidural space Materials: Tray: Epidural Needle(s): Epidural (Tuohy) Qty: 1 Length: (59mm) 3.5-inch Gauge: 22G   Meds ordered this encounter  Medications   iohexol (OMNIPAQUE) 180 MG/ML injection 10 mL    Must be Myelogram-compatible. If not available, you may substitute with a water-soluble, non-ionic, hypoallergenic, myelogram-compatible radiological contrast medium.   lidocaine (XYLOCAINE) 2 % (with pres) injection 400 mg   diazepam (VALIUM) tablet 5 mg    Make sure Flumazenil is available in the pyxis when using this medication. If oversedation occurs, administer 0.2 mg IV over 15 sec. If after 45 sec no response, administer 0.2 mg again over 1 min; may repeat at 1 min intervals; not to exceed 4 doses (1 mg)   ropivacaine (PF) 2 mg/mL (0.2%) (NAROPIN) injection 1 mL   sodium chloride flush (NS) 0.9 % injection 1 mL   dexamethasone (DECADRON) injection 10 mg  3 cc solution made of 1 cc of preservative-free saline, 1 cc of 0.2% ropivacaine, 1 cc of Decadron 10 mg/cc.   Orders Placed This Encounter  Procedures   DG PAIN CLINIC C-ARM 1-60 MIN NO REPORT    Intraoperative interpretation by procedural physician at [place].    Standing Status:   Standing    Number of Occurrences:   1    Order Specific Question:  Reason  for exam:    Answer:   Assistance in needle guidance and placement for procedures requiring needle placement in or near specific anatomical locations not easily accessible without such assistance.     Time-out:   I initiated and conducted the "Time-out" before starting the procedure, as per protocol. The patient was asked to participate by confirming the accuracy of the "Time Out" information. Verification of the correct person, site, and procedure were performed and confirmed by me, the nursing staff, and the patient. "Time-out" conducted as per Joint Commission's Universal Protocol (UP.01.01.01). Procedure checklist: Completed   H&P (Pre-op  Assessment)  Charlene Keith is a 56 y.o. (year old), female patient, seen today for interventional treatment. She  has a past surgical history that includes Abdominal hysterectomy; Nose surgery; Wisdom tooth extraction (Bilateral); Nose surgery; Colonoscopy (2014); and Breast lumpectomy with radioactive seed localization (Left, 01/02/2022). Charlene Keith has a current medication list which includes the following prescription(s): acetaminophen, cetirizine, cyanocobalamin, cyclobenzaprine, estradiol, famotidine, magnesium, meloxicam, tramadol, tretinoin, and vitamin d, and the following Facility-Administered Medications: dexamethasone, diazepam, iohexol, lidocaine, ropivacaine (pf) 2 mg/ml (0.2%), and sodium chloride flush. Her primarily concern today is the No chief complaint on file.  She is allergic to penicillins.   Last encounter: My last encounter with her was on 12/24/2021. Pertinent problems: Charlene Keith does not have any pertinent problems on file. Pain Assessment: Severity of   is reported as a  /10. Location:    / . Onset:  . Quality:  . Timing:  . Modifying factor(s):  Marland Kitchen Vitals:  vitals were not taken for this visit.   Reason for encounter: Interventional pain management therapy due pain of at least four (4) weeks in duration, with to failure to respond to  and/or inability to tolerate more conservative care.   Site Confirmation: Charlene Keith was asked to confirm the procedure and laterality before marking the site.  Consent: Before the procedure and under the influence of no sedative(s), amnesic(s), or anxiolytics, the patient was informed of the treatment options, risks and possible complications. To fulfill our ethical and legal obligations, as recommended by the American Medical Association's Code of Ethics, I have informed the patient of my clinical impression; the nature and purpose of the treatment or procedure; the risks, benefits, and possible complications of the intervention; the alternatives, including doing nothing; the risk(s) and benefit(s) of the alternative treatment(s) or procedure(s); and the risk(s) and benefit(s) of doing nothing. The patient was provided information about the general risks and possible complications associated with the procedure. These may include, but are not limited to: failure to achieve desired goals, infection, bleeding, organ or nerve damage, allergic reactions, paralysis, and death. In addition, the patient was informed of those risks and complications associated to Spine-related procedures, such as failure to decrease pain; infection (i.e.: Meningitis, epidural or intraspinal abscess); bleeding (i.e.: epidural hematoma, subarachnoid hemorrhage, or any other type of intraspinal or peri-dural bleeding); organ or nerve damage (i.e.: Any type of peripheral nerve, nerve root, or spinal cord injury) with subsequent damage to sensory, motor, and/or autonomic systems, resulting in permanent pain, numbness, and/or weakness of one or several areas of the body; allergic reactions; (i.e.: anaphylactic reaction); and/or death. Furthermore, the patient was informed of those risks and complications associated with the medications. These include, but are not limited to: allergic reactions (i.e.: anaphylactic or anaphylactoid  reaction(s)); adrenal axis suppression; blood sugar elevation that in diabetics may result in ketoacidosis or comma; water retention that in patients  with history of congestive heart failure may result in shortness of breath, pulmonary edema, and decompensation with resultant heart failure; weight gain; swelling or edema; medication-induced neural toxicity; particulate matter embolism and blood vessel occlusion with resultant organ, and/or nervous system infarction; and/or aseptic necrosis of one or more joints. Finally, the patient was informed that Medicine is not an exact science; therefore, there is also the possibility of unforeseen or unpredictable risks and/or possible complications that may result in a catastrophic outcome. The patient indicated having understood very clearly. We have given the patient no guarantees and we have made no promises. Enough time was given to the patient to ask questions, all of which were answered to the patient's satisfaction. Ms. Marcon has indicated that she wanted to continue with the procedure. Attestation: I, the ordering provider, attest that I have discussed with the patient the benefits, risks, side-effects, alternatives, likelihood of achieving goals, and potential problems during recovery for the procedure that I have provided informed consent.  Date   Time: {[facility]-PAIN TIME CHOICES:21018001}   Prophylactic antibiotics  Anti-infectives (From admission, onward)    None      Indication(s): None identified   Description of procedure   Start Time:   hrs  Local Anesthesia: Once the patient was positioned, prepped, and time-out was completed. The target area was identified located. The skin was marked with an approved surgical skin marker. Once marked, the skin (epidermis, dermis, and hypodermis), and deeper tissues (fat, connective tissue and muscle) were infiltrated with a small amount of a short-acting local anesthetic, loaded on a 10cc syringe with  a 25G, 1.5-in  Needle. An appropriate amount of time was allowed for local anesthetics to take effect before proceeding to the next step. Local Anesthetic: Lidocaine 1-2% The unused portion of the local anesthetic was discarded in the proper designated containers. Safety Precautions: Aspiration looking for blood return was conducted prior to all injections. At no point did I inject any substances, as a needle was being advanced. Before injecting, the patient was told to immediately notify me if she was experiencing any new onset of "ringing in the ears, or metallic taste in the mouth". No attempts were made at seeking any paresthesias. Safe injection practices and needle disposal techniques used. Medications properly checked for expiration dates. SDV (single dose vial) medications used. After the completion of the procedure, all disposable equipment used was discarded in the proper designated medical waste containers.  Technical description: Protocol guidelines were followed. Using fluoroscopic guidance, the epidural needle was introduced through the skin, ipsilateral to the reported pain, and advanced to the target area. Posterior laminar os was contacted and the needle walked caudad, until the lamina was cleared. The ligamentum flavum was engaged and the epidural space identified using loss-of-resistance technique with 2-3 ml of PF-NaCl (0.9% NSS), in a 5cc dedicated LOR syringe. See "Imaging guidance" below for use of contrast details.  Injection: Once satisfactory needle placement was confirmed, I proceeded to inject the desired solution in slow, incremental fashion, intermittently assessing for discomfort or any signs of abnormal or undesired spread of substance. Once completed, the needle was removed and disposed of, as per hospital protocols.   There were no vitals filed for this visit.  End Time:   hrs  Once the entire procedure was completed, the treated area was cleaned, making sure to leave  some of the prepping solution back to take advantage of its long term bactericidal properties.   Imaging guidance  Type of Imaging  Technique: Fluoroscopy Guidance (Spinal) Indication(s): Assistance in needle guidance and placement for procedures requiring needle placement in or near specific anatomical locations not easily accessible without such assistance. Exposure Time: Please see nurses notes for exact fluoroscopy time. Contrast: Before injecting any contrast, we confirmed that the patient did not have an allergy to iodine, shellfish, or radiological contrast. Once satisfactory needle placement was completed, radiological contrast was injected under continuous fluoroscopic guidance. Injection of contrast accomplished without complications. See chart for type and volume of contrast used. Fluoroscopic Guidance: I was personally present in the fluoroscopy suite, where the patient was placed in position for the procedure, over the fluoroscopy-compatible table. Fluoroscopy was manipulated, using "Tunnel Vision Technique", to obtain the best possible view of the target area, on the affected side. Parallax error was corrected before commencing the procedure. A "direction-depth-direction" technique was used to introduce the needle under continuous pulsed fluoroscopic guidance. Once the target was reached, antero-posterior, oblique, and lateral fluoroscopic projection views were taken to confirm needle placement in all planes. Electronic images uploaded into EMR.  Interpretation: Successful epidural injection. Intraoperative imaging interpretation by performing Physician.    Post-op assessment  Post-procedure Vital Signs:  Pulse/HCG Rate:    Temp:   Resp:   BP:   SpO2:    EBL: None  Complications: No immediate post-treatment complications observed by team, or reported by patient.  Note: The patient tolerated the entire procedure well. A repeat set of vitals were taken after the procedure and the  patient was kept under observation following institutional policy, for this type of procedure. Post-procedural neurological assessment was performed, showing return to baseline, prior to discharge. The patient was provided with post-procedure discharge instructions, including a section on how to identify potential problems. Should any problems arise concerning this procedure, the patient was given instructions to immediately contact us, at any time, without hesitation. In any case, we plan to contact the patient by telephone for a follow-up status report regarding this interventional procedure.  Comments:  No additional relevant information.   Plan of care  Chronic Opioid Analgesic:  {There is no content from the last Subjective section.}   Medications administered: [name] had no medications administered during this visit.  Follow-up plan:   Return in about 4 weeks (around 02/18/2022) for Post Procedure Evaluation, virtual.      {There is no content from the last Plan section.}   Recent Visits Date Type Provider Dept  12/24/21 Office Visit Gillis Santa, MD [facility]  11/26/21 Procedure visit Gillis Santa, MD [facility]  11/20/21 Office Visit Gillis Santa, MD [facility]  Showing recent visits within past 90 days and meeting all other requirements Today's Visits Date Type Provider Dept  01/21/22 Procedure visit Gillis Santa, MD [facility]  Showing today's visits and meeting all other requirements Future Appointments No visits were found meeting these conditions. Showing future appointments within next 90 days and meeting all other requirements   Disposition: Discharge home  Discharge (Date   Time): 01/21/2022;   hrs.   Primary Care Physician: Montel Culver, MD Location: [facility] Note by: Gillis Santa, MD Date: 01/21/2022; Time: 11:27 AM  DISCLAIMER: Medicine is not an exact science. It has no  guarantees or warranties. The decision to proceed with this intervention was based on the information collected from the patient. Conclusions were drawn from the patient's questionnaire, interview, and examination. Because information was provided in large part by the patient, it cannot  be guaranteed that it has not been purposely or unconsciously manipulated or altered. Every effort has been made to obtain as much accurate, relevant, available data as possible. Always take into account that the treatment will also be dependent on availability of resources and existing treatment guidelines, considered by other Pain Management Specialists as being common knowledge and practice, at the time of the intervention. It is also important to point out that variation in procedural techniques and pharmacological choices are the acceptable norm. For Medico-Legal review purposes, the indications, contraindications, technique, and results of the these procedures should only be evaluated, judged and interpreted by a Board-Certified Interventional Pain Specialist with extensive familiarity and expertise in the same exact procedure and technique.

## 2022-01-21 NOTE — Patient Instructions (Signed)

## 2022-01-21 NOTE — Progress Notes (Signed)
PROVIDER NOTE: Interpretation of information contained herein should be left to medically-trained personnel. Specific patient instructions are provided elsewhere under "Patient Instructions" section of medical record. This document was created in part using STT-dictation technology, any transcriptional errors that may result from this process are unintentional.  Patient: Charlene Keith Type: Established DOB: 03-04-1966 MRN: [id number] PCP: Montel Culver, MD  Service: Procedure DOS: 01/21/2022 Setting: Ambulatory Location: Ambulatory outpatient facility Delivery: Face-to-face Provider: Gillis Santa, MD Specialty: Interventional Pain Management Specialty designation: 09 Location: Outpatient facility Ref. Prov.: Montel Culver, MD   Procedure [facility] Interventional Pain Management )   Interlaminar Cervical Epidural Steroid injection (ESI) #2 (#1 done 11/26/21)  Laterality: Left Level: C7-T1 Imaging: Fluoroscopy-assisted DOS: 01/21/2022 Performed by: Holley Raring Anesthesia: Local anesthesia (1-2% Lidocaine) Anxiolysis: Oral 5 mg PO Valium Sedation: None.   Purpose: Diagnostic/Therapeutic Indications: Cervicalgia, cervical radicular pain, degenerative disc disease, severe enough to impact quality of life or function. 1. Cervical radicular pain (L>R)   2. Cervical disc herniation   3. Chronic pain syndrome    NAS-11 score:   Pre-procedure: 2 /10   Post-procedure: 0-No pain/10     Pre-Procedure Preparation  Monitoring: As per clinic protocol. Respiration, ETCO2, SpO2, BP, heart rate and rhythm monitor placed and checked for adequate function  Risk Assessment: Vitals:  JHE:[id number] body mass index is 29.29 kg/m as calculated from the following:   Height as of this encounter: 5\' 5"  (1.651 m).   Weight as of this encounter: 176 lb (79.8 kg)., Rate:81ECG Heart Rate: 71, BP:120/74, Resp:16, Temp:98.2 F (36.8 C), SpO2:97 %  Allergies: She is allergic to penicillins.  Precautions: None  required  Blood-thinner(s): None at this time  Coagulopathies: Reviewed. None identified.   Active Infection(s): Reviewed. None identified. Charlene Keith is afebrile   Location setting: Procedure suite Position: Prone, on modified reverse trendelenburg to facilitate breathing, with head in head-cradle. Pillows positioned under chest (below chin-level) with cervical spine flexed. Safety Precautions: Patient was assessed for positional comfort and pressure points before starting the procedure. Prepping solution: DuraPrep (Iodine Povacrylex [0.7% available iodine] and Isopropyl Alcohol, 74% w/w) Prep Area: Entire  cervicothoracic region Approach: percutaneous, paramedial Intended target: Posterior cervical epidural space Materials: Tray: Epidural Needle(s): Epidural (Tuohy) Qty: 1 Length: (88mm) 3.5-inch Gauge: 22G  4 cc solution made of 2 cc of preservative-free saline, 1 cc of 0.2% ropivacaine, 1 cc of Decadron 10 mg/cc.    Meds ordered this encounter  Medications   iohexol (OMNIPAQUE) 180 MG/ML injection 10 mL    Must be Myelogram-compatible. If not available, you may substitute with a water-soluble, non-ionic, hypoallergenic, myelogram-compatible radiological contrast medium.   lidocaine (XYLOCAINE) 2 % (with pres) injection 400 mg   diazepam (VALIUM) tablet 5 mg    Make sure Flumazenil is available in the pyxis when using this medication. If oversedation occurs, administer 0.2 mg IV over 15 sec. If after 45 sec no response, administer 0.2 mg again over 1 min; may repeat at 1 min intervals; not to exceed 4 doses (1 mg)   ropivacaine (PF) 2 mg/mL (0.2%) (NAROPIN) injection 1 mL   sodium chloride flush (NS) 0.9 % injection 1 mL   dexamethasone (DECADRON) injection 10 mg    Orders Placed This Encounter  Procedures   DG PAIN CLINIC C-ARM 1-60 MIN NO REPORT    Intraoperative interpretation by procedural physician at [facility].    Standing Status:   Standing    Number of  Occurrences:   1  Order Specific Question:   Reason for exam:    Answer:   Assistance in needle guidance and placement for procedures requiring needle placement in or near specific anatomical locations not easily accessible without such assistance.     Time-out: 1148 I initiated and conducted the "Time-out" before starting the procedure, as per protocol. The patient was asked to participate by confirming the accuracy of the "Time Out" information. Verification of the correct person, site, and procedure were performed and confirmed by me, the nursing staff, and the patient. "Time-out" conducted as per Joint Commission's Universal Protocol (UP.01.01.01). Procedure checklist: Completed   H&P (Pre-op  Assessment)  Charlene Keith is a 56 y.o. (year old), female patient, seen today for interventional treatment. She  has a past surgical history that includes Abdominal hysterectomy; Nose surgery; Wisdom tooth extraction (Bilateral); Nose surgery; Colonoscopy (2014); and Breast lumpectomy with radioactive seed localization (Left, 01/02/2022). Charlene Keith has a current medication list which includes the following prescription(s): acetaminophen, cetirizine, cyanocobalamin, cyclobenzaprine, estradiol, famotidine, magnesium, meloxicam, tramadol, tretinoin, and vitamin d. Her primarily concern today is the Back Pain (upper)  She is allergic to penicillins.   Last encounter: My last encounter with her was on 11/20/2021. Pertinent problems: Charlene Keith does not have any pertinent problems on file. Pain Assessment: Severity of   is reported as a 2 /10. Location:   Upper/radiates down both arms to fingers; also c/o stabbing pains in armpits bilat. Onset: More than a month ago. Quality: Aching. Timing: Intermittent. Modifying factor(s): stretching. Vitals:  height is 5\' 5"  (1.651 m) and weight is 176 lb (79.8 kg). Her temporal temperature is 98.2 F (36.8 C). Her blood pressure is 118/83 and her pulse is 81. Her respiration  is 10 and oxygen saturation is 99%.   Reason for encounter: Interventional pain management therapy due pain of at least four (4) weeks in duration, with to failure to respond to and/or inability to tolerate more conservative care.   Related imaging: Cervical MR wo contrast:  Results for orders placed during the hospital encounter of 09/03/21  MR Cervical Spine Wo Contrast  Narrative CLINICAL DATA:  Cervical radiculopathy at C8. Cervical spondylosis. Cervical radiculopathy, no red flags. Additional history provided by scanning technologist: Patient reports neck pain with left shoulder and left arm pain.  EXAM: MRI CERVICAL SPINE WITHOUT CONTRAST  TECHNIQUE: Multiplanar, multisequence MR imaging of the cervical spine was performed. No intravenous contrast was administered.  COMPARISON:  Radiographs of the cervical spine 05/19/2021.  FINDINGS: Alignment: Mild cervical levocurvature. Reversal of the expected cervical lordosis. No significant spondylolisthesis.  Vertebrae: Redemonstrated mild chronic anterior wedge deformity of the T1 vertebral body. Vertebral body height is otherwise maintained. No significant marrow edema or focal suspicious osseous lesion.  Cord: No signal abnormality identified within the cervical spinal cord.  Posterior Fossa, vertebral arteries, paraspinal tissues: No abnormality identified within included portions of the posterior fossa. Flow voids preserved within the imaged cervical vertebral arteries. Paraspinal soft tissues unremarkable.  Disc levels:  Multilevel disc degeneration, greatest at C4-C5 (moderate) and C5-C6 (moderate/advanced).  C2-C3: Minimal facet arthrosis. No significant disc herniation or stenosis.  C3-C4: No significant disc herniation or stenosis.  C4-C5: Disc bulge. Superimposed small left center disc protrusion. The disc protrusion results in mild spinal canal narrowing (with contact upon the ventral spinal cord). No  significant foraminal stenosis.  C5-C6: Disc bulge with endplate spurring and right greater than left uncovertebral hypertrophy. The disc bulge results in mild relative spinal canal narrowing brain (with  contact upon the ventral spinal cord). Mild/moderate right neural foraminal narrowing.  C6-C7: Possible small central zone posterior annular fissure. Shallow disc bulge. Minimal partial effacement of the ventral thecal sac (without spinal cord mass effect). No significant foraminal stenosis.  C7-T1: No significant disc herniation or stenosis.  T1-T2: Imaged sagittally. Disc bulge. Mild partial effacement of the ventral thecal sac (without spinal cord mass effect). No appreciable significant foraminal stenosis.  IMPRESSION: Cervical and upper thoracic spondylosis, as outlined and with findings most notably as follows.  At C4-C5, there is moderate disc degeneration. Disc bulge. Superimposed small left center disc protrusion. The disc protrusion results in mild spinal canal narrowing (with contact upon the ventral spinal cord). No significant foraminal stenosis.  At C5-C6, there is moderate/advanced disc degeneration. Disc bulge with endplate spurring and right greater than left uncovertebral hypertrophy. The disc bulge results in mild relative spinal canal narrowing (with contact upon the ventral spinal cord). Mild/moderate right neural foraminal narrowing.  At C6-C7, there is mild disc degeneration. Possible small central zone posterior annular fissure. Shallow disc bulge. Minimal partial effacement of the ventral thecal sac (without spinal cord mass effect). No significant foraminal stenosis.  At T1-T2, a disc bulge contributes to mild relative spinal canal narrowing (without spinal cord mass effect).  Reversal of the expected cervical lordosis  Mild cervical levocurvature.   Electronically Signed By: Kellie Simmering D.O. On: 09/03/2021 14:54    Narrative CLINICAL  DATA:  Paraspinal left greater than right spasm.  EXAM: CERVICAL SPINE - COMPLETE 4+ VIEW  COMPARISON:  None.  FINDINGS: Slight reversal of the normal cervical lordosis. Slight retrolisthesis of C5 on C6, likely degenerative given degenerative changes at this level. Lateral masses of C1 and C2 are aligned on the open-mouth odontoid view. Mild height loss of the T1 vertebral body anteriorly. Vertebral body heights are maintained. Moderate degenerative disc disease at C5-C6 with disc height loss and endplate spurring. Prevertebral soft tissues are within normal limits.  IMPRESSION: 1. Mild height loss of the T1 vertebral body anteriorly, which could represent degenerative remodeling or age-indeterminant superior endplate fracture. If the patient is focally tender or has a history of recent trauma then a CT or MRI of the thoracic spine is recommended to further evaluate. 2. Moderate degenerative disc disease at C5-C6.   Electronically Signed By: Margaretha Sheffield MD On: 05/20/2021 09:17    Site Confirmation: Charlene Keith was asked to confirm the procedure and laterality before marking the site.  Consent: Before the procedure and under the influence of no sedative(s), amnesic(s), or anxiolytics, the patient was informed of the treatment options, risks and possible complications. To fulfill our ethical and legal obligations, as recommended by the American Medical Association's Code of Ethics, I have informed the patient of my clinical impression; the nature and purpose of the treatment or procedure; the risks, benefits, and possible complications of the intervention; the alternatives, including doing nothing; the risk(s) and benefit(s) of the alternative treatment(s) or procedure(s); and the risk(s) and benefit(s) of doing nothing. The patient was provided information about the general risks and possible complications associated with the procedure. These may include, but are not limited  to: failure to achieve desired goals, infection, bleeding, organ or nerve damage, allergic reactions, paralysis, and death. In addition, the patient was informed of those risks and complications associated to Spine-related procedures, such as failure to decrease pain; infection (i.e.: Meningitis, epidural or intraspinal abscess); bleeding (i.e.: epidural hematoma, subarachnoid hemorrhage, or any other type of intraspinal  or peri-dural bleeding); organ or nerve damage (i.e.: Any type of peripheral nerve, nerve root, or spinal cord injury) with subsequent damage to sensory, motor, and/or autonomic systems, resulting in permanent pain, numbness, and/or weakness of one or several areas of the body; allergic reactions; (i.e.: anaphylactic reaction); and/or death. Furthermore, the patient was informed of those risks and complications associated with the medications. These include, but are not limited to: allergic reactions (i.e.: anaphylactic or anaphylactoid reaction(s)); adrenal axis suppression; blood sugar elevation that in diabetics may result in ketoacidosis or comma; water retention that in patients with history of congestive heart failure may result in shortness of breath, pulmonary edema, and decompensation with resultant heart failure; weight gain; swelling or edema; medication-induced neural toxicity; particulate matter embolism and blood vessel occlusion with resultant organ, and/or nervous system infarction; and/or aseptic necrosis of one or more joints. Finally, the patient was informed that Medicine is not an exact science; therefore, there is also the possibility of unforeseen or unpredictable risks and/or possible complications that may result in a catastrophic outcome. The patient indicated having understood very clearly. We have given the patient no guarantees and we have made no promises. Enough time was given to the patient to ask questions, all of which were answered to the patient's satisfaction.  Charlene Keith has indicated that she wanted to continue with the procedure. Attestation: I, the ordering provider, attest that I have discussed with the patient the benefits, risks, side-effects, alternatives, likelihood of achieving goals, and potential problems during recovery for the procedure that I have provided informed consent.  Date   Time: 01/21/2022 11:20 AM   Description of procedure   Start Time: 1148 hrs  Local Anesthesia: Once the patient was positioned, prepped, and time-out was completed. The target area was identified located. The skin was marked with an approved surgical skin marker. Once marked, the skin (epidermis, dermis, and hypodermis), and deeper tissues (fat, connective tissue and muscle) were infiltrated with a small amount of a short-acting local anesthetic, loaded on a 10cc syringe with a 25G, 1.5-in  Needle. An appropriate amount of time was allowed for local anesthetics to take effect before proceeding to the next step. Local Anesthetic: Lidocaine 1-2% The unused portion of the local anesthetic was discarded in the proper designated containers. Safety Precautions: Aspiration looking for blood return was conducted prior to all injections. At no point did I inject any substances, as a needle was being advanced. Before injecting, the patient was told to immediately notify me if she was experiencing any new onset of "ringing in the ears, or metallic taste in the mouth". No attempts were made at seeking any paresthesias. Safe injection practices and needle disposal techniques used. Medications properly checked for expiration dates. SDV (single dose vial) medications used. After the completion of the procedure, all disposable equipment used was discarded in the proper designated medical waste containers.  Technical description: Protocol guidelines were followed. Using fluoroscopic guidance, the epidural needle was introduced through the skin, ipsilateral to the reported pain, and  advanced to the target area. Posterior laminar os was contacted and the needle walked caudad, until the lamina was cleared. The ligamentum flavum was engaged and the epidural space identified using loss-of-resistance technique with 2-3 ml of PF-NaCl (0.9% NSS), in a 5cc dedicated LOR syringe. See "Imaging guidance" below for use of contrast details.  Injection: Once satisfactory needle placement was confirmed, I proceeded to inject the desired solution in slow, incremental fashion, intermittently assessing for discomfort or any signs  of abnormal or undesired spread of substance. Once completed, the needle was removed and disposed of, as per hospital protocols.   Vitals:   01/21/22 1128 01/21/22 1150 01/21/22 1153  BP: 120/74 122/84 118/83  Pulse: 81    Resp: 16 10 10   Temp: 98.2 F (36.8 C)    TempSrc: Temporal    SpO2: 97% 99% 99%  Weight: 176 lb (79.8 kg)    Height: 5\' 5"  (1.651 m)      End Time: 1152 hrs  Once the entire procedure was completed, the treated area was cleaned, making sure to leave some of the prepping solution back to take advantage of its long term bactericidal properties.   Imaging guidance  Type of Imaging Technique: Fluoroscopy Guidance (Spinal) Indication(s): Assistance in needle guidance and placement for procedures requiring needle placement in or near specific anatomical locations not easily accessible without such assistance. Exposure Time: Please see nurses notes for exact fluoroscopy time. Contrast: Before injecting any contrast, we confirmed that the patient did not have an allergy to iodine, shellfish, or radiological contrast. Once satisfactory needle placement was completed, radiological contrast was injected under continuous fluoroscopic guidance. Injection of contrast accomplished without complications. See chart for type and volume of contrast used. Fluoroscopic Guidance: I was personally present in the fluoroscopy suite, where the patient was placed in  position for the procedure, over the fluoroscopy-compatible table. Fluoroscopy was manipulated, using "Tunnel Vision Technique", to obtain the best possible view of the target area, on the affected side. Parallax error was corrected before commencing the procedure. A "direction-depth-direction" technique was used to introduce the needle under continuous pulsed fluoroscopic guidance. Once the target was reached, antero-posterior, oblique, and lateral fluoroscopic projection views were taken to confirm needle placement in all planes. Electronic images uploaded into EMR.  Interpretation: Successful epidural injection. Intraoperative imaging interpretation by performing Physician.    Post-op assessment  Post-procedure Vital Signs:  Pulse/HCG Rate: 8172 Temp: 98.2 F (36.8 C) Resp: 10 BP: 118/83 SpO2: 99 %  EBL: None  Complications: No immediate post-treatment complications observed by team, or reported by patient.  Note: The patient tolerated the entire procedure well. A repeat set of vitals were taken after the procedure and the patient was kept under observation following institutional policy, for this type of procedure. Post-procedural neurological assessment was performed, showing return to baseline, prior to discharge. The patient was provided with post-procedure discharge instructions, including a section on how to identify potential problems. Should any problems arise concerning this procedure, the patient was given instructions to immediately contact us, at any time, without hesitation. In any case, we plan to contact the patient by telephone for a follow-up status report regarding this interventional procedure.  Comments:  No additional relevant information.  5 out of 5 strength bilateral upper extremity: Shoulder abduction, elbow flexion, elbow extension, thumb extension.   Plan of care   Medications administered: We administered iohexol, lidocaine, diazepam, ropivacaine (PF) 2 mg/mL  (0.2%), sodium chloride flush, and dexamethasone.  Follow-up plan:   Return in about 4 weeks (around 02/18/2022) for Post Procedure Evaluation, virtual.     Recent Visits Date Type Provider Dept  12/24/21 Office Visit Gillis Santa, MD [facility]  11/26/21 Procedure visit Gillis Santa, MD [facility]  11/20/21 Office Visit Gillis Santa, MD [facility]  Showing recent visits within past 90 days and meeting all other requirements Today's Visits Date Type Provider Dept  01/21/22 Procedure visit Gillis Santa, MD [facility]  Showing today's visits and meeting all other requirements Future Appointments Date Type Provider Dept  02/18/22 Appointment Gillis Santa, MD [facility]  Showing future appointments within next 90 days and meeting all other requirements   Disposition: Discharge home  Discharge (Date   Time): 01/21/2022; 1200 hrs.   Primary Care Physician: Montel Culver, MD Location: [facility] Note by: Gillis Santa, MD Date: 01/21/2022; Time: 12:09 PM  DISCLAIMER: Medicine is not an exact science. It has no guarantees or warranties. The decision to proceed with this intervention was based on the information collected from the patient. Conclusions were drawn from the patient's questionnaire, interview, and examination. Because information was provided in large part by the patient, it cannot be guaranteed that it has not been purposely or unconsciously manipulated or altered. Every effort has been made to obtain as much accurate, relevant, available data as possible. Always take into account that the treatment will also be dependent on availability of resources and existing treatment guidelines, considered by other Pain Management Specialists as being common knowledge and practice, at the time of the intervention. It is also important to point out that variation in procedural techniques and  pharmacological choices are the acceptable norm. For Medico-Legal review purposes, the indications, contraindications, technique, and results of the these procedures should only be evaluated, judged and interpreted by a Board-Certified Interventional Pain Specialist with extensive familiarity and expertise in the same exact procedure and technique.

## 2022-01-22 ENCOUNTER — Telehealth: Payer: Self-pay | Admitting: *Deleted

## 2022-01-22 NOTE — Telephone Encounter (Signed)
Post procedure call;  voicemail left for patient to call if there are any questions or concerns.

## 2022-02-12 ENCOUNTER — Encounter (HOSPITAL_COMMUNITY): Payer: Self-pay

## 2022-02-17 ENCOUNTER — Encounter: Payer: Self-pay | Admitting: Family Medicine

## 2022-02-17 ENCOUNTER — Other Ambulatory Visit: Payer: Self-pay

## 2022-02-17 ENCOUNTER — Telehealth: Payer: Self-pay | Admitting: Student in an Organized Health Care Education/Training Program

## 2022-02-17 ENCOUNTER — Ambulatory Visit (INDEPENDENT_AMBULATORY_CARE_PROVIDER_SITE_OTHER): Payer: BC Managed Care – PPO | Admitting: Family Medicine

## 2022-02-17 VITALS — BP 110/70 | HR 98 | Ht 65.0 in | Wt 181.6 lb

## 2022-02-17 DIAGNOSIS — Z0000 Encounter for general adult medical examination without abnormal findings: Secondary | ICD-10-CM | POA: Insufficient documentation

## 2022-02-17 DIAGNOSIS — M62838 Other muscle spasm: Secondary | ICD-10-CM

## 2022-02-17 DIAGNOSIS — Z13220 Encounter for screening for lipoid disorders: Secondary | ICD-10-CM

## 2022-02-17 DIAGNOSIS — K649 Unspecified hemorrhoids: Secondary | ICD-10-CM

## 2022-02-17 DIAGNOSIS — R7989 Other specified abnormal findings of blood chemistry: Secondary | ICD-10-CM

## 2022-02-17 DIAGNOSIS — Z23 Encounter for immunization: Secondary | ICD-10-CM

## 2022-02-17 DIAGNOSIS — K5904 Chronic idiopathic constipation: Secondary | ICD-10-CM

## 2022-02-17 MED ORDER — HYDROCORTISONE (PERIANAL) 2.5 % EX CREA: 1.0000 "application " | TOPICAL_CREAM | Freq: Two times a day (BID) | CUTANEOUS | 1 refills | Status: DC

## 2022-02-17 MED ORDER — MELOXICAM 15 MG PO TABS: ORAL_TABLET | ORAL | 2 refills | Status: DC

## 2022-02-17 NOTE — Assessment & Plan Note (Addendum)
Annual examination completed, risk stratification labs ordered, anticipatory guidance provided.  We will follow labs once resulted.  Shingrix 1 of 2 vaccine administered today. ?

## 2022-02-17 NOTE — Assessment & Plan Note (Signed)
As needed meloxicam, patient has reported roughly 90-95% improvement following interventional spine injections through Dr. Holley Raring. ?

## 2022-02-17 NOTE — Telephone Encounter (Signed)
Returned patient call.

## 2022-02-17 NOTE — Assessment & Plan Note (Signed)
Has had benefit from [facility] in the past, new Rx provided.  Additionally, I reviewed dietary/lifestyle modifications to address constipation component to minimize risk factors associated with furthering of hemorrhoids. ?

## 2022-02-17 NOTE — Patient Instructions (Addendum)
-   Obtain fasting labs with orders provided (can have water or black coffee but otherwise no food or drink x 8 hours before labs) ?- Review information provided ?-Use hemorrhoid cream as instructed until resolution ?- Take MiraLAX daily, adjust dose to ensure 1-3 smooth bowel movements without straining ?- Review information provided regarding further dietary changes ?- Attend eye doctor annually, dentist every 6 months, work towards or maintain 30 minutes of moderate intensity physical activity at least 5 days per week, and consume a balanced diet ?- Return in 2 months for 2nd Shingrix and follow-up ?- Contact us for any questions between now and then ?

## 2022-02-17 NOTE — Assessment & Plan Note (Signed)
Given recurrent episodes of hemorrhoids, underlying concern for constipation relayed as well as dietary/lifestyle modifications to address this. ?

## 2022-02-17 NOTE — Assessment & Plan Note (Signed)
>>  ASSESSMENT AND PLAN FOR CERVICAL PARASPINAL MUSCLE SPASM WRITTEN ON 02/17/2022  8:40 AM BY Chelci [place], Ocie Bob, MD  As needed meloxicam, patient has reported roughly 90-95% improvement following interventional spine injections through Dr. Cherylann Ratel.

## 2022-02-17 NOTE — Progress Notes (Signed)
?  ? ?Annual Physical Exam Visit ? ?Patient Information:  ?Patient ID: Charlene Keith, female DOB: July 06, 1966 Age: 56 y.o. MRN: [id number]  ? ?Subjective:  ? ?CC: Annual Physical Exam ? ?HPI:  ?Charlene Keith is here for their annual physical. ? ?I reviewed the past medical history, family history, social history, surgical history, and allergies today and changes were made as necessary.  Please see the problem list section below for additional details. ? ?Past Medical History: ?Past Medical History:  ?Diagnosis Date  ? Left breast mass 2023  ? benign x 2  ? Ovarian cyst   ? ?Past Surgical History: ?Past Surgical History:  ?Procedure Laterality Date  ? ABDOMINAL HYSTERECTOMY  2013  ? BREAST LUMPECTOMY WITH RADIOACTIVE SEED LOCALIZATION Left 01/02/2022  ? Procedure: LEFT BREAST LUMPECTOMY WITH RADIOACTIVE SEED LOCALIZATION;  Surgeon: [name], MD;  Location: [facility];  Service: General;  Laterality: Left;  ? COLONOSCOPY  2014  ? NOSE SURGERY  1990  ? WISDOM TOOTH EXTRACTION Bilateral 2010  ? ?Family History: ?Family History  ?Problem Relation Age of Onset  ? Hypertension Mother   ? Lymphoma Mother   ? Healthy Father   ? ?Allergies: ?Allergies  ?Allergen Reactions  ? Penicillins Rash  ? ?Health Maintenance: ?Health Maintenance  ?Topic Date Due  ? Zoster Vaccines- Shingrix (1 of 2) Never done  ? PAP SMEAR-Modifier  Never done  ? MAMMOGRAM  Never done  ? COVID-19 Vaccine (4 - Booster for Moderna series) 04/30/2021  ? COLONOSCOPY (Pts 45-66yr Insurance coverage will need to be confirmed)  07/11/2022 (Originally 02/22/2013)  ? TETANUS/TDAP  08/27/2024  ? INFLUENZA VACCINE  Completed  ? HPV VACCINES  Aged Out  ? Hepatitis C Screening  Discontinued  ? HIV Screening  Discontinued  ?  ?HM Colonoscopy        ? ? Postponed - COLONOSCOPY (Pts 45-472yrInsurance coverage will need to be confirmed) (Every 10 Years) Postponed until 07/11/2022  ? ? No completion history exists for this topic.  ? ?  ?  ? ?   ? ?Medications: ?Current Outpatient Medications on File Prior to Visit  ?Medication Sig Dispense Refill  ? acetaminophen (TYLENOL) 325 MG tablet Take 162.5 mg by mouth every 6 (six) hours as needed for moderate pain.    ? cetirizine (ZYRTEC) 10 MG tablet Take 10 mg by mouth daily as needed for allergies.    ? Cyanocobalamin (B-12 PO) Take 1 capsule by mouth daily.    ? cyclobenzaprine (FLEXERIL) 5 MG tablet Take 1-2 tablets (5-10 mg total) by mouth at bedtime as needed for muscle spasms. One half to one tab PO qHS, then increase gradually to one tab TID. (Patient taking differently: Take 5 mg by mouth at bedtime as needed for muscle spasms.) 60 tablet 0  ? estradiol (CLIMARA - DOSED IN MG/24 HR) 0.05 mg/24hr patch Place 1 patch onto the skin every Monday.    ? famotidine (PEPCID) 20 MG tablet Take 20 mg by mouth daily as needed for heartburn or indigestion.    ? Magnesium 500 MG TABS Take 500 mg by mouth daily.    ? traMADol (ULTRAM) 50 MG tablet Take 1 tablet (50 mg total) by mouth every 6 (six) hours as needed. 20 tablet 0  ? tretinoin (RETIN-A) 0.025 % cream Apply 1 application topically at bedtime.    ? VITAMIN D PO Take 1 capsule by mouth daily.    ? ?No current facility-administered medications on file prior to visit.  ? ? ?  Review of Systems: No headache, visual changes, nausea, vomiting, diarrhea, constipation, dizziness, abdominal pain, skin rash, fevers, chills, night sweats, swollen lymph nodes, weight loss, chest pain, body aches, joint swelling, muscle aches, shortness of breath, mood changes, visual or auditory hallucinations reported. ? ?Objective:  ? ?Vitals:  ? 02/17/22 0759  ?BP: 110/70  ?Pulse: 98  ?SpO2: 98%  ? ?Vitals:  ? 02/17/22 0759  ?Weight: 181 lb 9.6 oz (82.4 kg)  ?Height: '5\' 5"'$  (1.651 m)  ? ?Body mass index is 30.22 kg/m?. ? ?General: Well Developed, well nourished, and in no acute distress.  ?Neuro: Alert and oriented x3, extra-ocular muscles intact, sensation grossly intact. Cranial  nerves II through XII are grossly intact, motor, sensory, and coordinative functions are intact.  Diminished bilateral patellar and Achilles reflexes, symmetric brachioradialis reflexes ?HEENT: Normocephalic, atraumatic, pupils equal round reactive to light, neck supple, no masses, no lymphadenopathy, thyroid nonpalpable. Oropharynx, nasopharynx, external ear canals are unremarkable. ?Skin: Warm and dry, no rashes noted.  ?Cardiac: Regular rate and rhythm, no murmurs rubs or gallops. No peripheral edema. Pulses symmetric. ?Respiratory: Clear to auscultation bilaterally. Not using accessory muscles, speaking in full sentences.  ?Abdominal: Soft, nontender, nondistended, hypoactive bowel sounds, no masses, no organomegaly. ?Musculoskeletal: Shoulder, elbow, wrist, hip, knee, ankle stable, and with full range of motion. ? ?Female chaperone initials: [place] present throughout the physical examination. ? ?Impression and Recommendations:  ? ?The patient was counselled, risk factors were discussed, and anticipatory guidance given. ? ?Hemorrhoids ?Has had benefit from [facility] Rx in the past, new Rx provided.  Additionally, I reviewed dietary/lifestyle modifications to address constipation component to minimize risk factors associated with furthering of hemorrhoids. ? ?Chronic idiopathic constipation ?Given recurrent episodes of hemorrhoids, underlying concern for constipation relayed as well as dietary/lifestyle modifications to address this. ? ?Cervical paraspinal muscle spasm ?As needed meloxicam, patient has reported roughly 90-95% improvement following interventional spine injections through Dr. Holley Raring. ? ?Annual physical exam ?Annual examination completed, risk stratification labs ordered, anticipatory guidance provided.  We will follow labs once resulted. ? ?Orders & Medications ?Medications:  ?Meds ordered this encounter  ?Medications  ? meloxicam (MOBIC) 15 MG tablet  ?  Sig: TAKE 1 TABLET BY MOUTH EVERY DAY AS NEEDED  FOR PAIN  ?  Dispense:  30 tablet  ?  Refill:  2  ? hydrocortisone (ANUSOL-HC) 2.5 % rectal cream  ?  Sig: Place 1 application. rectally 2 (two) times daily.  ?  Dispense:  30 g  ?  Refill:  1  ? ?Orders Placed This Encounter  ?Procedures  ? Apo A1 + B + Ratio  ? CBC  ? Comprehensive metabolic panel  ? Lipid panel  ? TSH  ? VITAMIN D 25 Hydroxy (Vit-D Deficiency, Fractures)  ?  ? ?Return in about 2 months (around 04/19/2022) for 2nd Shingrix, follow-up GI.  ? ? ?Montel Culver, MD ? ? Primary Care Sports Medicine ?[facility] ?[place]  ? ?

## 2022-02-18 ENCOUNTER — Encounter: Payer: Self-pay | Admitting: Student in an Organized Health Care Education/Training Program

## 2022-02-18 ENCOUNTER — Ambulatory Visit
Payer: BC Managed Care – PPO | Attending: Student in an Organized Health Care Education/Training Program | Admitting: Student in an Organized Health Care Education/Training Program

## 2022-02-18 DIAGNOSIS — M5020 Other cervical disc displacement, unspecified cervical region: Secondary | ICD-10-CM

## 2022-02-18 DIAGNOSIS — M5412 Radiculopathy, cervical region: Secondary | ICD-10-CM | POA: Diagnosis not present

## 2022-02-18 DIAGNOSIS — G894 Chronic pain syndrome: Secondary | ICD-10-CM

## 2022-02-18 LAB — LIPID PANEL
Chol/HDL Ratio: 3.4 ratio (ref 0.0–4.4)
Cholesterol, Total: 209 mg/dL — ABNORMAL HIGH (ref 100–199)
HDL: 62 mg/dL (ref >39)
LDL Chol Calc (NIH): 121 mg/dL — ABNORMAL HIGH (ref 0–99)
Triglycerides: 147 mg/dL (ref 0–149)
VLDL Cholesterol Cal: 26 mg/dL (ref 5–40)

## 2022-02-18 LAB — COMPREHENSIVE METABOLIC PANEL
ALT: 16 IU/L (ref 0–32)
AST: 20 IU/L (ref 0–40)
Albumin/Globulin Ratio: 1.5 (ref 1.2–2.2)
Albumin: 4.3 g/dL (ref 3.8–4.9)
Alkaline Phosphatase: 80 IU/L (ref 44–121)
BUN/Creatinine Ratio: 14 (ref 9–23)
BUN: 11 mg/dL (ref 6–24)
Bilirubin Total: 0.4 mg/dL (ref 0.0–1.2)
CO2: 28 mmol/L (ref 20–29)
Calcium: 9.3 mg/dL (ref 8.7–10.2)
Chloride: 102 mmol/L (ref 96–106)
Creatinine, Ser: 0.77 mg/dL (ref 0.57–1.00)
Globulin, Total: 2.8 g/dL (ref 1.5–4.5)
Glucose: 89 mg/dL (ref 70–99)
Potassium: 4.3 mmol/L (ref 3.5–5.2)
Sodium: 140 mmol/L (ref 134–144)
Total Protein: 7.1 g/dL (ref 6.0–8.5)
eGFR: 92 mL/min/{1.73_m2} (ref >59)

## 2022-02-18 LAB — APO A1 + B + RATIO
Apolipo. B/A-1 Ratio: 0.5 ratio (ref 0.0–0.6)
Apolipoprotein A-1: 176 mg/dL (ref 116–209)
Apolipoprotein B: 95 mg/dL — ABNORMAL HIGH (ref <90)

## 2022-02-18 LAB — TSH: TSH: 4.98 u[IU]/mL — ABNORMAL HIGH (ref 0.450–4.500)

## 2022-02-18 LAB — VITAMIN D 25 HYDROXY (VIT D DEFICIENCY, FRACTURES): Vit D, 25-Hydroxy: 19.1 ng/mL — ABNORMAL LOW (ref 30.0–100.0)

## 2022-02-18 LAB — CBC
Hematocrit: 41.1 % (ref 34.0–46.6)
Hemoglobin: 13.7 g/dL (ref 11.1–15.9)
MCH: 29.1 pg (ref 26.6–33.0)
MCHC: 33.3 g/dL (ref 31.5–35.7)
MCV: 87 fL (ref 79–97)
Platelets: 428 10*3/uL (ref 150–450)
RBC: 4.7 x10E6/uL (ref 3.77–5.28)
RDW: 12.8 % (ref 11.7–15.4)
WBC: 7.1 10*3/uL (ref 3.4–10.8)

## 2022-02-18 NOTE — Progress Notes (Signed)
Patient: Charlene Keith  Service Category: E/M  Provider: Gillis Santa, MD  ?DOB: [birth year]  DOS: 02/18/2022  Location: Office  ?MRN: [id number]  Setting: Ambulatory outpatient  Referring Provider: Montel Culver, MD  ?Type: Established Patient  Specialty: Interventional Pain Management  PCP: Montel Culver, MD  ?Location: Remote location  Delivery: TeleHealth    ? ?Virtual Encounter - Pain Management ?PROVIDER NOTE: Information contained herein reflects review and annotations entered in association with encounter. Interpretation of such information and data should be left to medically-trained personnel. Information provided to patient can be located elsewhere in the medical record under "Patient Instructions". Document created using STT-dictation technology, any transcriptional errors that may result from process are unintentional.  ?  ?Contact & Pharmacy ?Preferred: [phone] ?Home: [phone] (home) ?Mobile: [phone] (mobile) ?E-mail: [email or web address]  ?[facility] [place] [address] ?Phone: [phone] Fax: [phone] ?  ?Pre-screening  ?Charlene Keith "in-person" vs "virtual" encounter. She indicated preferring virtual for this encounter.  ? ?Reason ?COVID-19*  Social distancing based on CDC and AMA recommendations.  ? ?I contacted Charlene Keith on 02/18/2022 via telephone.      I clearly identified myself as Gillis Santa, MD. I verified that I was speaking with the correct person using two identifiers (Name: Charlene Keith, and date of birth: Mar 17, 1966). ? ?Consent ?I sought verbal advanced consent from Charlene Keith for virtual visit interactions. I informed Charlene Keith of possible security and privacy concerns, risks, and limitations associated with providing "not-in-person" medical evaluation and management services. I also informed Charlene Keith of the availability of "in-person" appointments. Finally, I  informed her that there would be a charge for the virtual visit and that she could be  personally, fully or partially, financially responsible for it. Charlene Keith expressed understanding and agreed to proceed.  ? ?Historic Elements   ?Charlene Keith is a 56 y.o. year old, female patient evaluated today after our last contact on 02/17/2022. Charlene Keith  has a past medical history of Left breast mass (2023) and Ovarian cyst. She also  has a past surgical history that includes Abdominal hysterectomy (2013); Nose surgery (1990); Wisdom tooth extraction (Bilateral, 2010); Colonoscopy (2014); and Breast lumpectomy with radioactive seed localization (Left, 01/02/2022). Charlene Keith has a current medication list which includes the following prescription(s): acetaminophen, cetirizine, cyanocobalamin, cyclobenzaprine, estradiol, famotidine, hydrocortisone, magnesium, meloxicam, tramadol, tretinoin, and vitamin d. She  reports that she quit smoking about 26 years ago. Her smoking use included cigarettes. She has never used smokeless tobacco. She reports that she does not currently use alcohol. She reports that she does not use drugs. Charlene Keith is allergic to penicillins.  ? ?HPI  ?Today, she is being contacted for a post-procedure assessment. ? ? ?Post-procedure evaluation  ?  Interlaminar Cervical Epidural Steroid injection (ESI) #2 (#1 done 11/26/21)  ?Laterality: Left ?Level: C7-T1 ?Imaging: Fluoroscopy-assisted ?DOS: 01/21/2022 ?Performed by: Holley Raring ?Anesthesia: Local anesthesia (1-2% Lidocaine) ?Anxiolysis: Oral 5 mg PO Valium ?Sedation: None.  ? ?Purpose: Diagnostic/Therapeutic ?Indications: Cervicalgia, cervical radicular pain, degenerative disc disease, severe enough to impact quality of life or function. ?1. Cervical radicular pain (L>R)   ?2. Cervical disc herniation   ?3. Chronic pain syndrome   ? ?NAS-11 score:  ? Pre-procedure: 2 /10  ? Post-procedure: 0-No pain/10  ?   ?Effectiveness:  ?Initial hour after  procedure: 90 %  ?Subsequent 4-6 hours post-procedure: 90 %  ?Analgesia past initial 6 hours: 90 % (patient  has good relief and reports that she is able to do most of her daily activities.)  ?Ongoing improvement:  ?Analgesic:  90% ?Function: Charlene Keith reports improvement in function ?ROM: Charlene Keith reports improvement in ROM ? ? ?Renal ?Lab Results  ?Component Value Date  ? BUN 11 02/17/2022  ? CREATININE 0.77 02/17/2022  ? BCR 14 02/17/2022  ?  Hepatic ?Lab Results  ?Component Value Date  ? AST 20 02/17/2022  ? ALT 16 02/17/2022  ? ALBUMIN 4.3 02/17/2022  ? ALKPHOS 80 02/17/2022  ?  ?Electrolytes ?Lab Results  ?Component Value Date  ? NA 140 02/17/2022  ? K 4.3 02/17/2022  ? CL 102 02/17/2022  ? CALCIUM 9.3 02/17/2022  ?  Bone ?Lab Results  ?Component Value Date  ? VD25OH 19.1 (L) 02/17/2022  ?  ?Inflammation (CRP: Acute Phase) (ESR: Chronic Phase) ?No results found for: CRP, ESRSEDRATE, LATICACIDVEN    ?  ? ?Note: Above Lab results reviewed. ? ?Assessment  ?The primary encounter diagnosis was Cervical radicular pain (L>R). Diagnoses of Cervical disc herniation and Chronic pain syndrome were also pertinent to this visit. ? ?Plan of Care  ?Significant analgesic and functional benefit after cervical epidural steroid injection #2.  Patient states that she is much more active and can complete household chores more comfortably.  We will continue to monitor her symptoms.  Repeat cervical ESI as needed. ? ?Follow-up plan:   ?Return if symptoms worsen or fail to improve.   ? ?Recent Visits ?Date Type Provider Dept  ?01/21/22 Procedure visit Gillis Santa, MD [facility]  ?12/24/21 Office Visit Gillis Santa, MD [facility]  ?11/26/21 Procedure visit Gillis Santa, MD [facility]  ?11/20/21 Office Visit Gillis Santa, MD [facility]  ?Showing recent visits within past 90 days and meeting all other requirements ?Today's Visits ?Date Type Provider Dept  ?02/18/22 Office Visit Gillis Santa, MD [facility]  ?Showing today's visits and meeting all other requirements ?Future Appointments ?No visits were found meeting these conditions. ?Showing future appointments within next 90 days and meeting all other requirements ? ?I discussed the assessment and treatment plan with the patient. The patient was provided an opportunity to ask questions and all were answered. The patient agreed with the plan and demonstrated an understanding of the instructions. ? ?Patient advised to call back or seek an in-person evaluation if the symptoms or condition worsens. ? ?Duration of encounter: 41mnutes. ? ?Note by: BGillis Santa MD ?Date: 02/18/2022; Time: 3:07 PM ?

## 2022-02-19 ENCOUNTER — Other Ambulatory Visit: Payer: Self-pay | Admitting: Family Medicine

## 2022-02-19 MED ORDER — VITAMIN D (ERGOCALCIFEROL) 1.25 MG (50000 UNIT) PO CAPS
50000.0000 [IU] | ORAL_CAPSULE | ORAL | 0 refills | Status: DC
Start: 2022-02-19 — End: 2022-06-23

## 2022-02-23 LAB — T3, FREE: T3, Free: 3.8 pg/mL (ref 2.0–4.4)

## 2022-02-23 LAB — T4: T4, Total: 9.1 ug/dL (ref 4.5–12.0)

## 2022-02-23 LAB — SPECIMEN STATUS REPORT

## 2022-04-20 ENCOUNTER — Ambulatory Visit (INDEPENDENT_AMBULATORY_CARE_PROVIDER_SITE_OTHER): Payer: BC Managed Care – PPO

## 2022-04-20 DIAGNOSIS — Z23 Encounter for immunization: Secondary | ICD-10-CM

## 2022-05-18 ENCOUNTER — Encounter: Payer: Self-pay | Admitting: Family Medicine

## 2022-05-18 ENCOUNTER — Ambulatory Visit: Payer: BC Managed Care – PPO | Admitting: Family Medicine

## 2022-05-18 VITALS — BP 118/70 | HR 82 | Ht 62.5 in | Wt 183.0 lb

## 2022-05-18 DIAGNOSIS — J019 Acute sinusitis, unspecified: Secondary | ICD-10-CM

## 2022-05-18 DIAGNOSIS — G4733 Obstructive sleep apnea (adult) (pediatric): Secondary | ICD-10-CM | POA: Insufficient documentation

## 2022-05-18 DIAGNOSIS — R635 Abnormal weight gain: Secondary | ICD-10-CM

## 2022-05-18 DIAGNOSIS — R0683 Snoring: Secondary | ICD-10-CM

## 2022-05-18 MED ORDER — BENZONATATE 200 MG PO CAPS: 200.0000 mg | ORAL_CAPSULE | Freq: Three times a day (TID) | ORAL | 0 refills | Status: DC | PRN

## 2022-05-18 MED ORDER — AZITHROMYCIN 250 MG PO TABS: ORAL_TABLET | ORAL | 0 refills | Status: AC

## 2022-05-18 NOTE — Patient Instructions (Addendum)
-   Continue Zyrtec daily - Start Flonase (fluticasone propionate), 2 sprays in each nostril daily x7 days - Start Mucinex twice daily x7 days (drink plenty of water while on this medication) - If symptoms fail to improve by this Friday, start antibiotics and take for full course - Perform home sleep study once you receive the materials - Return for follow-up in 5 weeks

## 2022-05-18 NOTE — Assessment & Plan Note (Addendum)
Patient describes chronic unintentional weight gain despite lifestyle measures in the setting of subclinical hypothyroidism. After discussion of her comorbid snoring plan for home sleep study, calorie tracking, and we will recheck labs at her return.

## 2022-05-18 NOTE — Progress Notes (Signed)
     Primary Care / Sports Medicine Office Visit  Patient Information:  Patient ID: Charlene Keith, female DOB: 1966/11/14 Age: 56 y.o. MRN: [id number]   Charlene Keith is a pleasant 56 y.o. female presenting with the following:  Chief Complaint  Patient presents with   Mass    Behind ear for 1 day, painful when she walks    Vitals:   05/18/22 1509  BP: 118/70  Pulse: 82  SpO2: 97%   Vitals:   05/18/22 1509  Weight: 183 lb (83 kg)  Height: 5' 2.5" (1.588 m)   Body mass index is 32.94 kg/m.  No results found.   Independent interpretation of notes and tests performed by another provider:   None  Procedures performed:   None  Pertinent History, Exam, Impression, and Recommendations:   Problem List Items Addressed This Visit       Respiratory   Acute rhinosinusitis - Primary    Patient with 1 day history of right ear pain, right posterior ear fullness, congestion, and throat pain; denies fevers, chills, cough, SOA. Examination shows benign oropharyngeal exam, nasal turbinates with mild erythema and swelling, right ear with fluid behind TM, canals and left TM benign, tenderness about bilateral maxillary sinuses, shotty cervical lymphadenopathy; benign cardiopulmonary findings.  Plan for patient to continue oral antihistamine, start Flonase, Mucinex, and to initiate azithromycin if symptoms fail to abate in several days.       Relevant Medications   azithromycin (ZITHROMAX) 250 MG tablet   benzonatate (TESSALON) 200 MG capsule     Other   Snoring    Snoring discussed in the setting of weight gain, will order HSS, and follow results accordingly.      Unintended weight gain    Patient describes chronic unintentional weight gain despite lifestyle measures in the setting of subclinical hypothyroidism. After discussion of her comorbid snoring plan for home sleep study, calorie tracking, and we will recheck labs at her return.        Orders & Medications Meds  ordered this encounter  Medications   azithromycin (ZITHROMAX) 250 MG tablet    Sig: Take 2 tablets on day 1, then 1 tablet daily on days 2 through 5    Dispense:  6 tablet    Refill:  0   benzonatate (TESSALON) 200 MG capsule    Sig: Take 1 capsule (200 mg total) by mouth 3 (three) times daily as needed for cough.    Dispense:  45 capsule    Refill:  0   No orders of the defined types were placed in this encounter.    Return in about 5 weeks (around 06/22/2022) for carpal tunnel eval and sleep study f/u.     Montel Culver, MD   Primary Care Sports Medicine [place]

## 2022-05-18 NOTE — Assessment & Plan Note (Signed)
Snoring discussed in the setting of weight gain, will order HSS, and follow results accordingly.

## 2022-05-18 NOTE — Assessment & Plan Note (Signed)
Patient with 1 day history of right ear pain, right posterior ear fullness, congestion, and throat pain; denies fevers, chills, cough, SOA. Examination shows benign oropharyngeal exam, nasal turbinates with mild erythema and swelling, right ear with fluid behind TM, canals and left TM benign, tenderness about bilateral maxillary sinuses, shotty cervical lymphadenopathy; benign cardiopulmonary findings.  Plan for patient to continue oral antihistamine, start Flonase, Mucinex, and to initiate azithromycin if symptoms fail to abate in several days.

## 2022-06-12 ENCOUNTER — Other Ambulatory Visit (HOSPITAL_COMMUNITY)
Admission: RE | Admit: 2022-06-12 | Discharge: 2022-06-12 | Disposition: A | Discharge status: Home / Self Care | Payer: BC Managed Care – PPO | Source: Ambulatory Visit | Attending: Family Medicine | Admitting: Family Medicine

## 2022-06-12 ENCOUNTER — Encounter: Payer: Self-pay | Admitting: Family Medicine

## 2022-06-12 ENCOUNTER — Ambulatory Visit: Payer: BC Managed Care – PPO | Admitting: Family Medicine

## 2022-06-12 VITALS — BP 110/78 | HR 88 | Ht 62.5 in | Wt 179.2 lb

## 2022-06-12 DIAGNOSIS — B356 Tinea cruris: Secondary | ICD-10-CM | POA: Diagnosis present

## 2022-06-12 DIAGNOSIS — R300 Dysuria: Secondary | ICD-10-CM

## 2022-06-12 LAB — POCT URINALYSIS DIPSTICK
Bilirubin, UA: NEGATIVE
Glucose, UA: NEGATIVE
Ketones, UA: NEGATIVE
Leukocytes, UA: NEGATIVE
Nitrite, UA: NEGATIVE
Protein, UA: NEGATIVE
Spec Grav, UA: 1.015 (ref 1.010–1.025)
Urobilinogen, UA: 0.2 E.U./dL
pH, UA: 6 (ref 5.0–8.0)

## 2022-06-12 MED ORDER — CLOTRIMAZOLE-BETAMETHASONE 1-0.05 % EX CREA: 1.0000 | TOPICAL_CREAM | Freq: Two times a day (BID) | CUTANEOUS | 0 refills | Status: DC

## 2022-06-12 NOTE — Assessment & Plan Note (Signed)
Patient with inguinal rash following recent trip to [place] describing hot, humid conditions, increased walking.  Has noted urinary frequency.  Examination reveals erythematous regions at bilateral inguinal area without lymphadenopathy, urine with him at urea noted.  Examination findings are most consistent with tinea cruris versus intertrigo.  Plan for Lotrisone twice daily, a self swab was obtained and will be sent in addition to urine culture for further analysis.

## 2022-06-12 NOTE — Progress Notes (Signed)
     Primary Care / Sports Medicine Office Visit  Patient Information:  Patient ID: Pallie Swigert, female DOB: [birth year] Age: 56 y.o. MRN: [id number]   Kairee Kozma is a pleasant 56 y.o. female presenting with the following:  Chief Complaint  Patient presents with   Vaginal Itching    Itching and redness in between legs for 2 days   Urinary Tract Infection    Urinating a lot for 2 days    Vitals:   06/12/22 1313  BP: 110/78  Pulse: 88  SpO2: 98%   Vitals:   06/12/22 1313  Weight: 179 lb 3.2 oz (81.3 kg)  Height: 5' 2.5" (1.588 m)   Body mass index is 32.25 kg/m.  No results found.   Independent interpretation of notes and tests performed by another provider:   None  Procedures performed:   None  Pertinent History, Exam, Impression, and Recommendations:   Problem List Items Addressed This Visit       Musculoskeletal and Integument   Tinea cruris - Primary    Patient with inguinal rash following recent trip to [place] describing hot, humid conditions, increased walking.  Has noted urinary frequency.  Examination reveals erythematous regions at bilateral inguinal area without lymphadenopathy, urine with him at urea noted.  Examination findings are most consistent with tinea cruris versus intertrigo.  Plan for Lotrisone twice daily, a self swab was obtained and will be sent in addition to urine culture for further analysis.      Relevant Medications   clotrimazole-betamethasone (LOTRISONE) cream   Other Relevant Orders   Cervicovaginal ancillary only   Other Visit Diagnoses     Dysuria       Relevant Orders   POCT urinalysis dipstick   Cervicovaginal ancillary only        Orders & Medications Meds ordered this encounter  Medications   clotrimazole-betamethasone (LOTRISONE) cream    Sig: Apply 1 Application topically 2 (two) times daily.    Dispense:  45 g    Refill:  0   Orders Placed This Encounter  Procedures   POCT urinalysis dipstick      No follow-ups on file.     Montel Culver, MD   Primary Care Sports Medicine [place]

## 2022-06-15 LAB — CERVICOVAGINAL ANCILLARY ONLY
Bacterial Vaginitis (gardnerella): NEGATIVE
Candida Glabrata: NEGATIVE
Candida Vaginitis: NEGATIVE
Chlamydia: NEGATIVE
Comment: NEGATIVE
Comment: NEGATIVE
Comment: NEGATIVE
Comment: NEGATIVE
Comment: NEGATIVE
Comment: NORMAL
Neisseria Gonorrhea: NEGATIVE
Trichomonas: NEGATIVE

## 2022-06-23 ENCOUNTER — Ambulatory Visit
Admission: RE | Admit: 2022-06-23 | Discharge: 2022-06-23 | Disposition: A | Discharge status: Home / Self Care | Payer: BC Managed Care – PPO | Attending: Family Medicine | Admitting: Family Medicine

## 2022-06-23 ENCOUNTER — Ambulatory Visit
Admission: RE | Admit: 2022-06-23 | Discharge: 2022-06-23 | Disposition: A | Discharge status: Home / Self Care | Payer: BC Managed Care – PPO | Source: Ambulatory Visit | Attending: Family Medicine | Admitting: Family Medicine

## 2022-06-23 ENCOUNTER — Ambulatory Visit (INDEPENDENT_AMBULATORY_CARE_PROVIDER_SITE_OTHER): Payer: BC Managed Care – PPO | Admitting: Family Medicine

## 2022-06-23 ENCOUNTER — Encounter: Payer: Self-pay | Admitting: Family Medicine

## 2022-06-23 VITALS — BP 122/80 | HR 80 | Ht 62.5 in | Wt 179.0 lb

## 2022-06-23 DIAGNOSIS — M25531 Pain in right wrist: Secondary | ICD-10-CM | POA: Diagnosis present

## 2022-06-23 DIAGNOSIS — G8929 Other chronic pain: Secondary | ICD-10-CM

## 2022-06-23 DIAGNOSIS — M25532 Pain in left wrist: Secondary | ICD-10-CM

## 2022-06-23 DIAGNOSIS — M62838 Other muscle spasm: Secondary | ICD-10-CM | POA: Diagnosis not present

## 2022-06-23 MED ORDER — MELOXICAM 15 MG PO TABS: ORAL_TABLET | ORAL | 2 refills | Status: DC

## 2022-06-23 NOTE — Patient Instructions (Signed)
-   Obtain x-rays today - Use wrist brace throughout the day x1 week, then reserve usage for as needed based on wrist pain - Start meloxicam and dose consistently x1 week, dose as needed weeks to - Talk to your work about ergonomics evaluation (contact us if needing a note) - Return for follow-up as scheduled

## 2022-06-23 NOTE — Assessment & Plan Note (Signed)
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  Examination reveals intact sensorimotor function, positive Tinel's on the right, negative left, negative Phalen's bilaterally, tenderness at the STT regions bilaterally, negative Finkelstein's, negative basal grind.  Plan for dedicated x-rays, bracing, meloxicam, follow-up in 2 weeks for close reevaluation and consideration of diagnostic/therapeutic corticosteroid injections.

## 2022-06-23 NOTE — Progress Notes (Signed)
     Primary Care / Sports Medicine Office Visit  Patient Information:  Patient ID: Charlene Keith, female DOB: 1966-04-25 Age: 56 y.o. MRN: [id number]   Charlene Keith is a pleasant 56 y.o. female presenting with the following:  Chief Complaint  Patient presents with   Wrist Pain    Both wrist pain, 1 year, xray of left not right   sleep study    Last day for test    Vitals:   06/23/22 1512  BP: 122/80  Pulse: 80  SpO2: 98%   Vitals:   06/23/22 1512  Weight: 179 lb (81.2 kg)  Height: 5' 2.5" (1.588 m)   Body mass index is 32.22 kg/m.  DG Wrist Complete Left  Result Date: 06/23/2022 CLINICAL DATA:  Chronic left wrist pain. EXAM: LEFT WRIST - COMPLETE 3+ VIEW COMPARISON:  None Available. FINDINGS: There is no evidence of fracture or dislocation. There is no evidence of arthropathy or other focal bone abnormality. Soft tissues are unremarkable. IMPRESSION: Normal examination. Electronically Signed   By: Claudie Revering M.D.   On: 06/23/2022 16:17   DG Wrist Complete Right  Result Date: 06/23/2022 CLINICAL DATA:  Chronic right wrist pain. EXAM: RIGHT WRIST - COMPLETE 3+ VIEW COMPARISON:  None Available. FINDINGS: There is no evidence of fracture or dislocation. There is no evidence of arthropathy or other focal bone abnormality. Soft tissues are unremarkable. IMPRESSION: Normal examination. Electronically Signed   By: Claudie Revering M.D.   On: 06/23/2022 16:15     Independent interpretation of notes and tests performed by another provider:   None  Procedures performed:   None  Pertinent History, Exam, Impression, and Recommendations:   Problem List Items Addressed This Visit       Other   Cervical paraspinal muscle spasm    History of prior cervical spine injections [facility], have advised ergonomics evaluation through her workplace, restart of home exercises instructed by PT, and meloxicam.  We will continue to follow this issue.      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  Examination reveals intact sensorimotor function, positive Tinel's on the right, negative left, negative Phalen's bilaterally, tenderness at the STT regions bilaterally, negative Finkelstein's, negative basal grind.  Plan for dedicated x-rays, bracing, meloxicam, follow-up in 2 weeks for close reevaluation and consideration of diagnostic/therapeutic corticosteroid injections.      Relevant Medications   meloxicam (MOBIC) 15 MG tablet   Other Relevant Orders   DG Wrist Complete Left (Completed)   DG Wrist Complete Right (Completed)     Orders & Medications Meds ordered this encounter  Medications   meloxicam (MOBIC) 15 MG tablet    Sig: TAKE 1 TABLET BY MOUTH EVERY DAY AS NEEDED FOR PAIN    Dispense:  30 tablet    Refill:  2   Orders Placed This Encounter  Procedures   DG Wrist Complete Left   DG Wrist Complete Right     No follow-ups on file.     Charlene Culver, MD   Primary Care Sports Medicine [employer]

## 2022-06-23 NOTE — Assessment & Plan Note (Signed)
History of prior cervical spine injections through pain and spine group, have advised ergonomics evaluation through her workplace, restart of home exercises instructed by PT, and meloxicam.  We will continue to follow this issue.

## 2022-06-23 NOTE — Assessment & Plan Note (Signed)
>>  ASSESSMENT AND PLAN FOR CERVICAL PARASPINAL MUSCLE SPASM WRITTEN ON 06/23/2022  5:07 PM BY Exa Bomba, Ocie Bob, MD  History of prior cervical spine injections through pain and spine group, have advised ergonomics evaluation through her workplace, restart of home exercises instructed by PT, and meloxicam.  We will continue to follow this issue.

## 2022-06-24 NOTE — Progress Notes (Signed)
Updated results

## 2022-07-10 ENCOUNTER — Ambulatory Visit: Payer: BC Managed Care – PPO | Admitting: Family Medicine

## 2022-07-10 ENCOUNTER — Encounter: Payer: Self-pay | Admitting: Family Medicine

## 2022-07-14 ENCOUNTER — Telehealth: Payer: Self-pay | Admitting: Family Medicine

## 2022-07-14 NOTE — Telephone Encounter (Signed)
Charlene Keith from [facility]. Stated received Rx for C-PAP Machine Rx is not dated. Needs new Rx signed and dated.  Mentioned may have been cut off via fax.     Call back - [phone] Fax- [phone]

## 2022-07-14 NOTE — Telephone Encounter (Signed)
Refaxed 07/15/2023

## 2022-08-03 ENCOUNTER — Ambulatory Visit: Payer: Self-pay | Admitting: *Deleted

## 2022-08-03 NOTE — Telephone Encounter (Signed)
Reason for Disposition  [1] Caller has URGENT medicine question about med that PCP or specialist prescribed AND [2] triager unable to answer question  Answer Assessment - Initial Assessment Questions 1. NAME of MEDICINE: "What medicine(s) are you calling about?"     Estradiol 0.05 Climara patch 2. QUESTION: "What is your question?" (e.g., double dose of medicine, side effect)     I have run out of my patches.   It's my fault now I am out.   I'm having panic attacks/anxiety today as a result.   I've had a hysterectomy so when I'm not on the patch my hormones get all off. 3. PRESCRIBER: "Who prescribed the medicine?" Reason: if prescribed by specialist, call should be referred to that group.     Requesting Dr. Rosette Reveal to refill it for her. 4. SYMPTOMS: "Do you have any symptoms?" If Yes, ask: "What symptoms are you having?"  "How bad are the symptoms (e.g., mild, moderate, severe)     Panic/anxiety because I ran out.   Just today I started feeling it. 5. PREGNANCY:  "Is there any chance that you are pregnant?" "When was your last menstrual period?"     N/A due to age and hysterectomy  Protocols used: Medication Question Call-A-AH

## 2022-08-03 NOTE — Telephone Encounter (Signed)
  Chief Complaint: Requesting a refill of her Climara patches. Symptoms: Today started having anxiety/panic attacks because ran out of it Frequency: today only Pertinent Negatives: Patient denies having problems with anxiety except when out of her patches Disposition: '[]'$ ED /'[]'$ Urgent Care (no appt availability in office) / '[]'$ Appointment(In office/virtual)/ '[]'$  [place] [place] [facility]/ '[]'$ Home Care/ '[]'$ Refused Recommended Disposition /'[]'$ [place] [facility]/ '[x]'$  Follow-up with PCP Additional Notes: Refill request sent to Dr. Rosette Reveal.

## 2022-10-23 ENCOUNTER — Other Ambulatory Visit: Payer: Self-pay | Admitting: Family Medicine

## 2022-11-17 IMAGING — CR DG LUMBAR SPINE COMPLETE 4+V
5 series · 5 of 5 positions shown · non-contrast
Comparison: None.

CLINICAL DATA: Low back pain after bending over yesterday.

EXAM:
LUMBAR SPINE - COMPLETE 4+ VIEW

[l-spine ap]
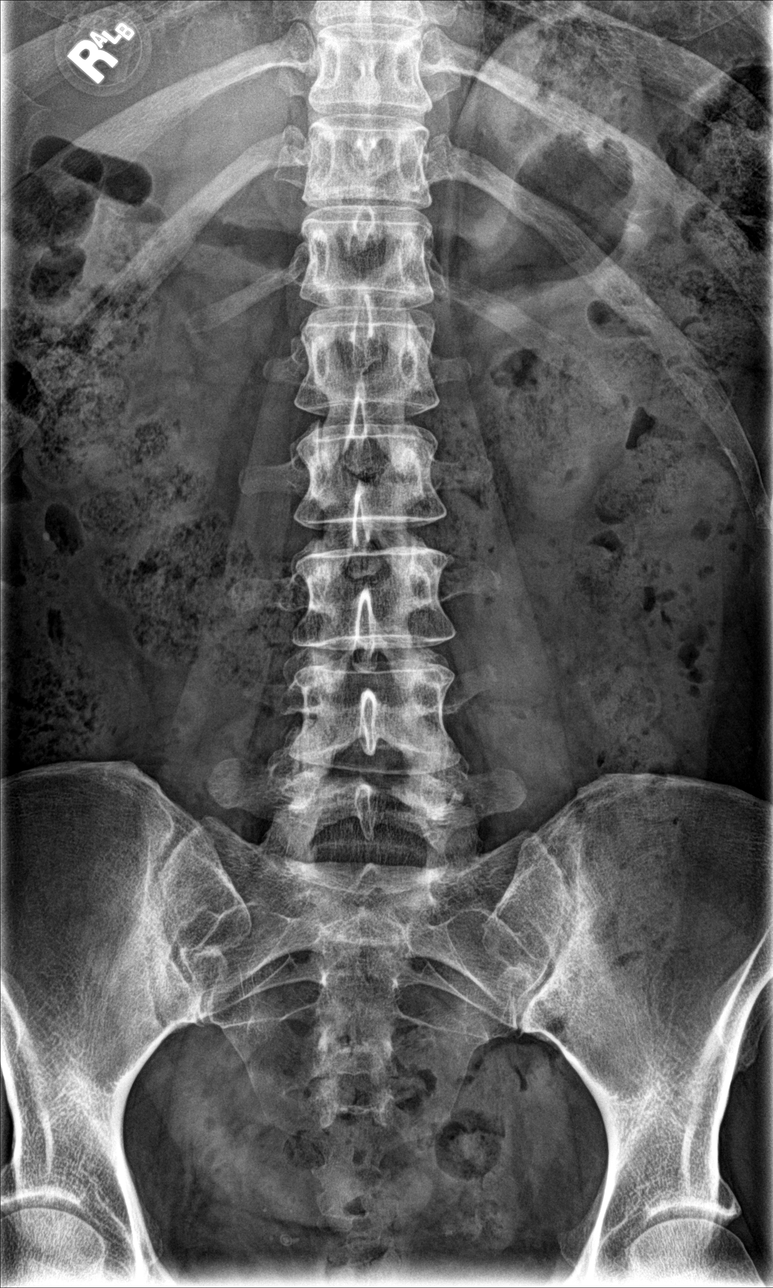

[l-spine obl (1 of 2)]
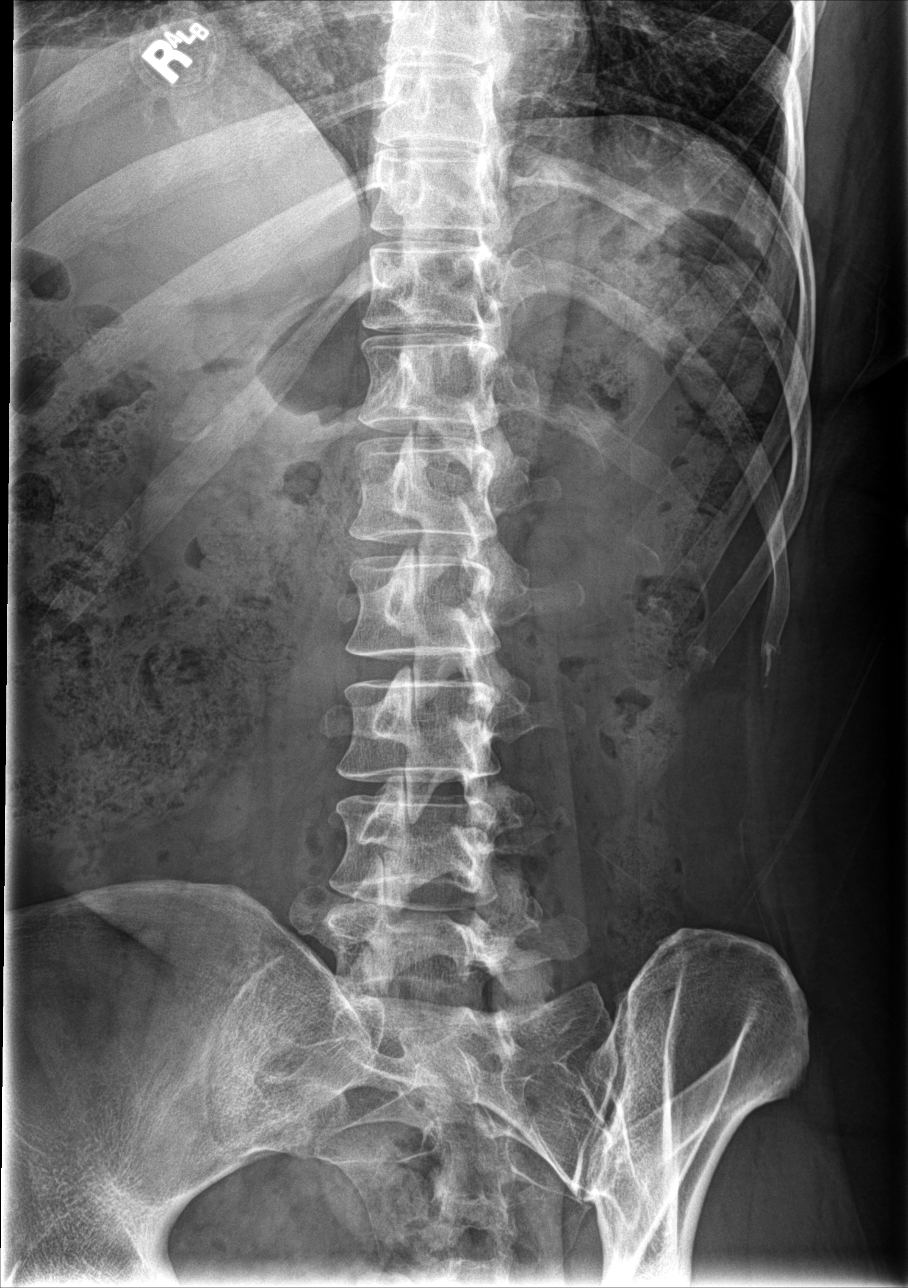

[l-spine obl (2 of 2)]
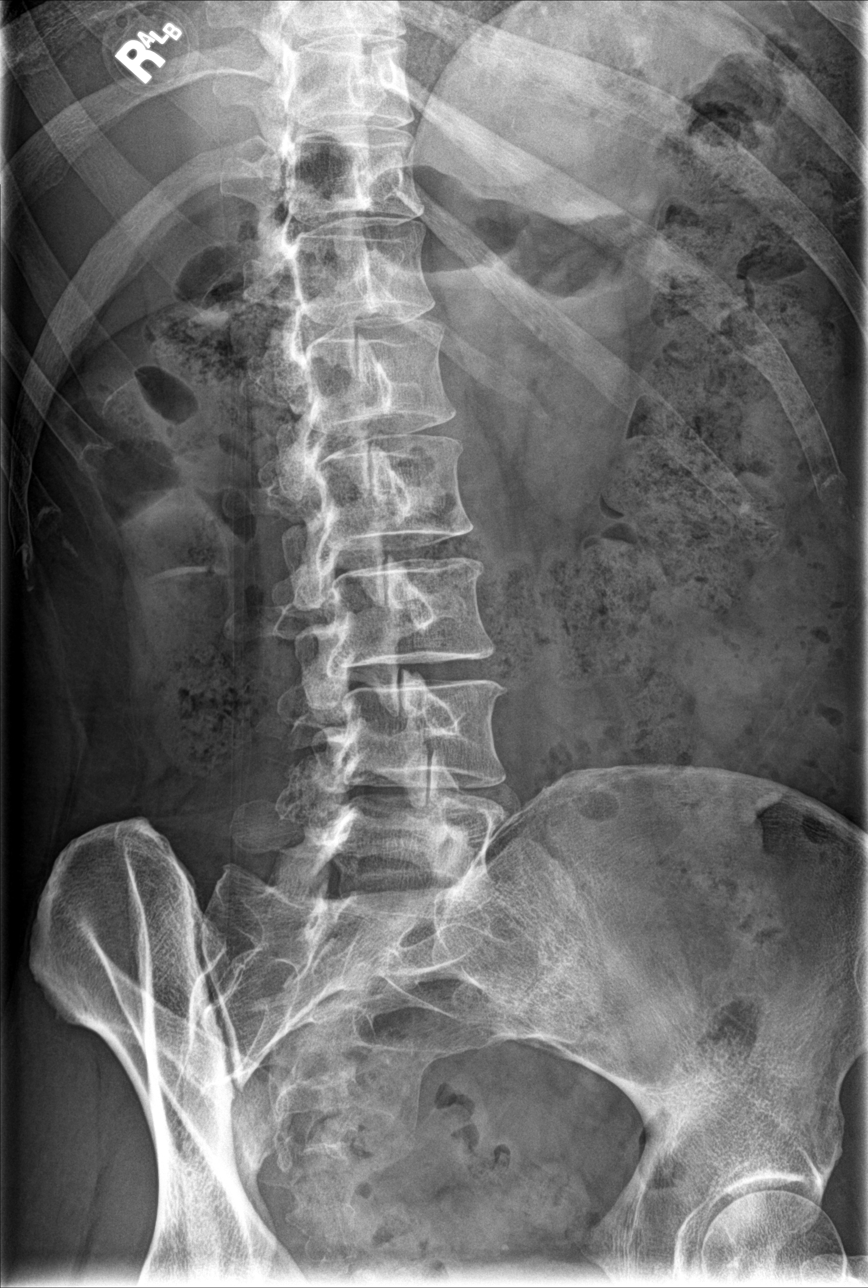

[l-spine lat]
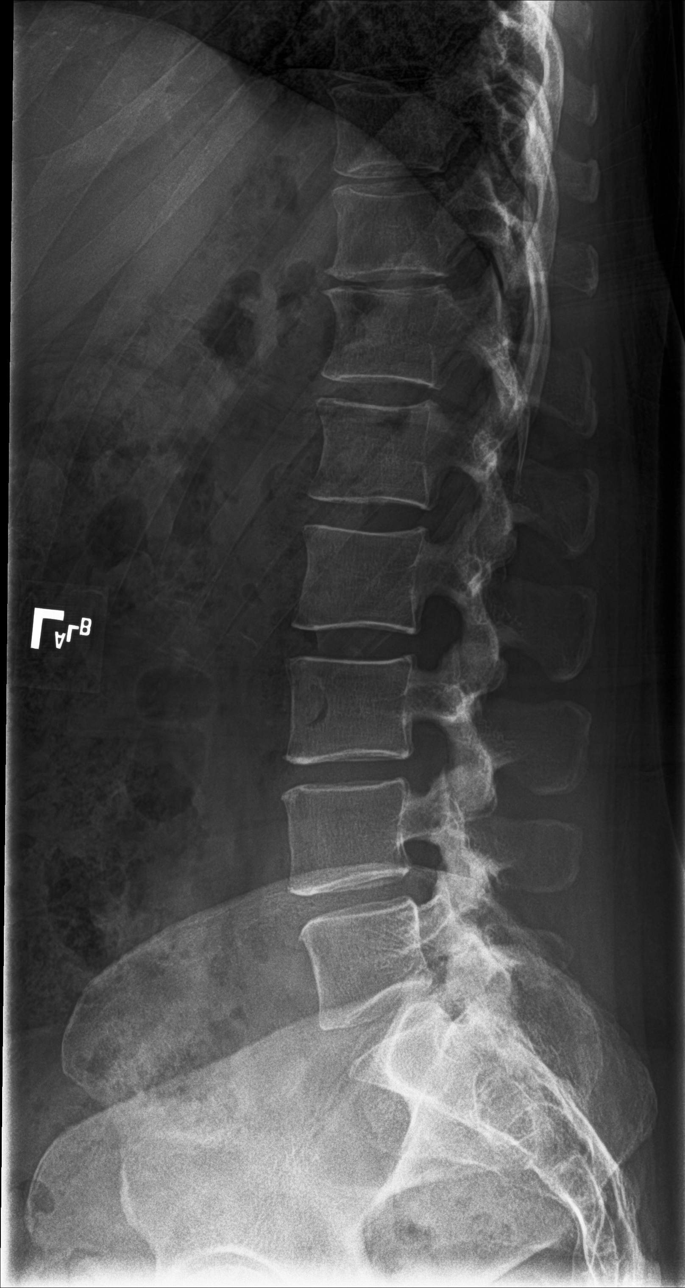

[l-spine spot]
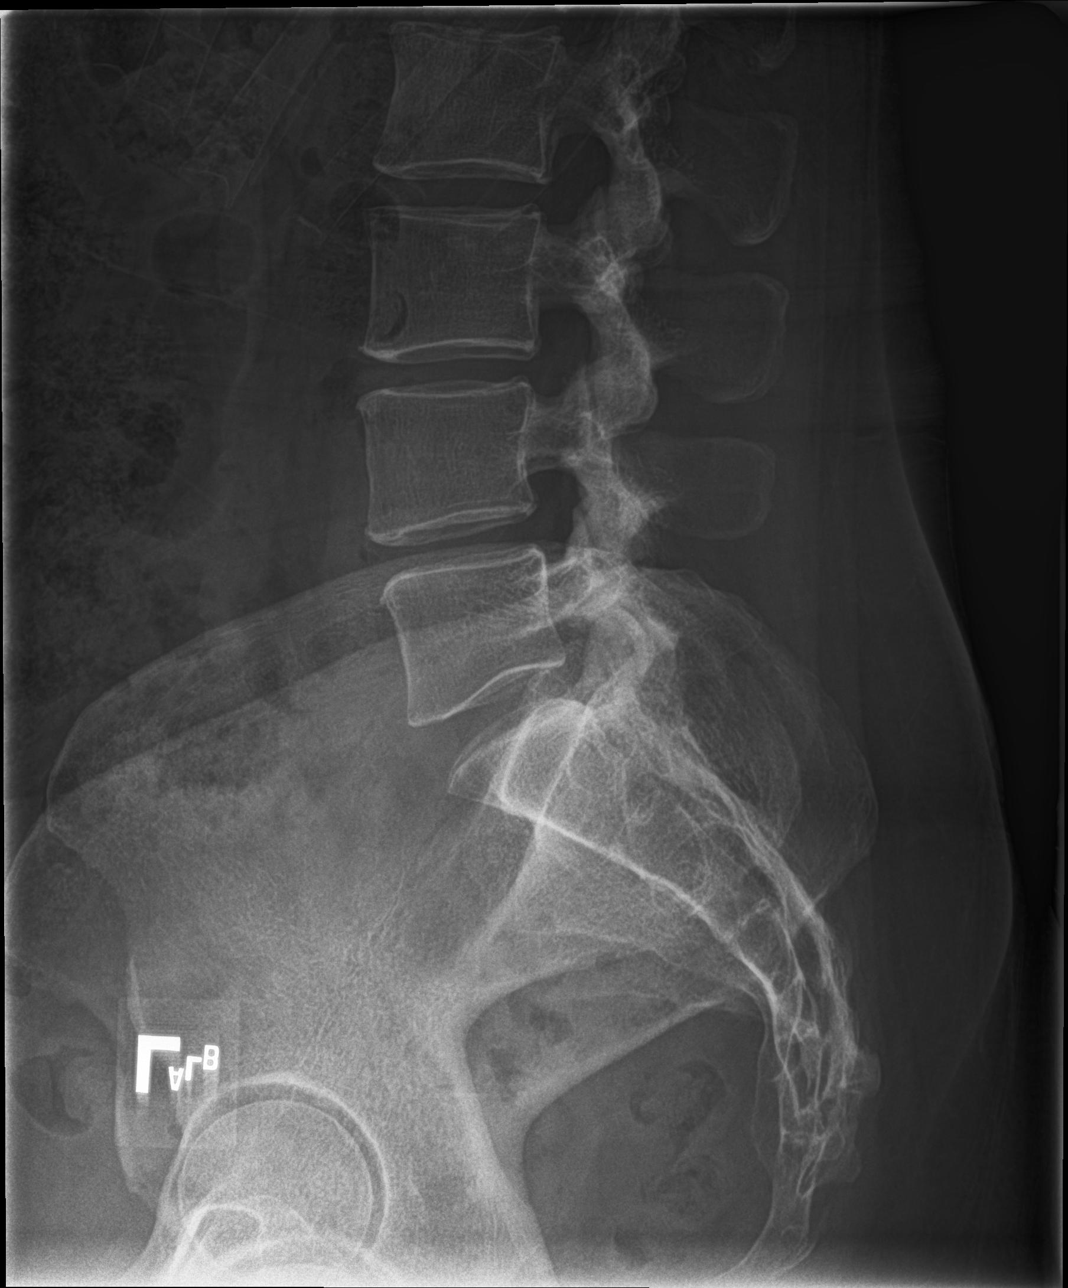

[5 of 5 positions shown; findings below may reference images not displayed]

FINDINGS: Five lumbar type vertebral bodies.

No acute fracture or subluxation. Vertebral body heights are
preserved.

Alignment is normal.

Mild disc height loss at T11-T12, L3-L4, and L4-L5. Mild right facet
arthropathy at L4-L5.

The sacroiliac joints are unremarkable.
IMPRESSION: 1. Mild degenerative disc disease at L3-L4 and L4-L5.

## 2022-11-17 IMAGING — CR DG WRIST COMPLETE 3+V*L*
4 series · 4 of 4 positions shown · non-contrast
Comparison: None.

CLINICAL DATA: Left wrist pain.

EXAM:
LEFT WRIST - COMPLETE 3+ VIEW

[wrist pa]
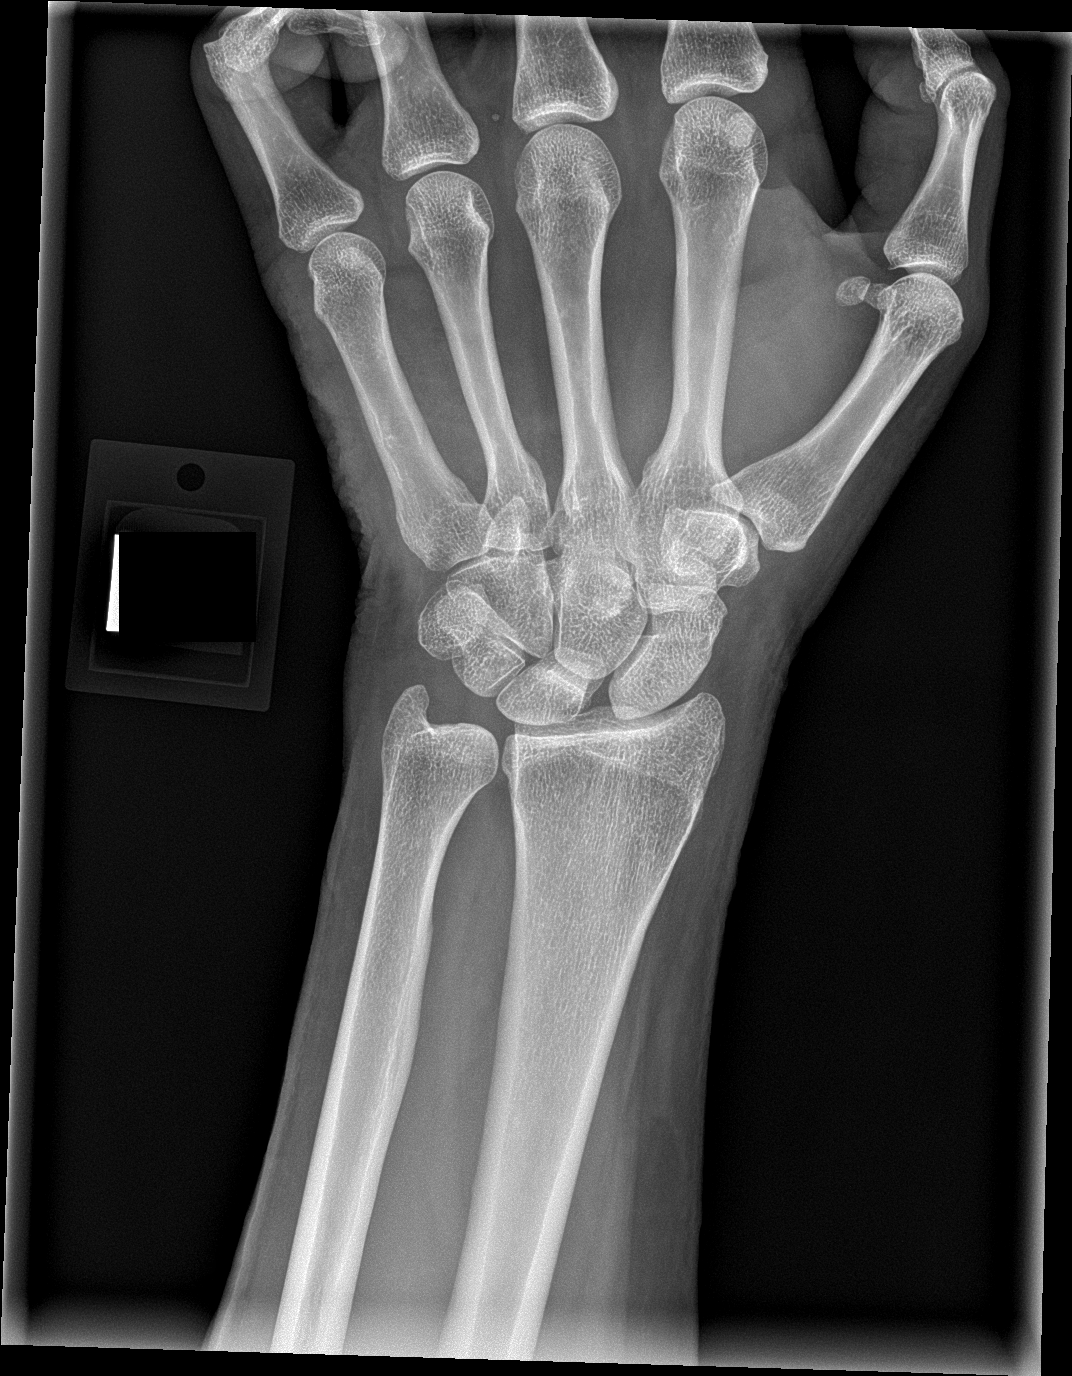

[wrist obl]
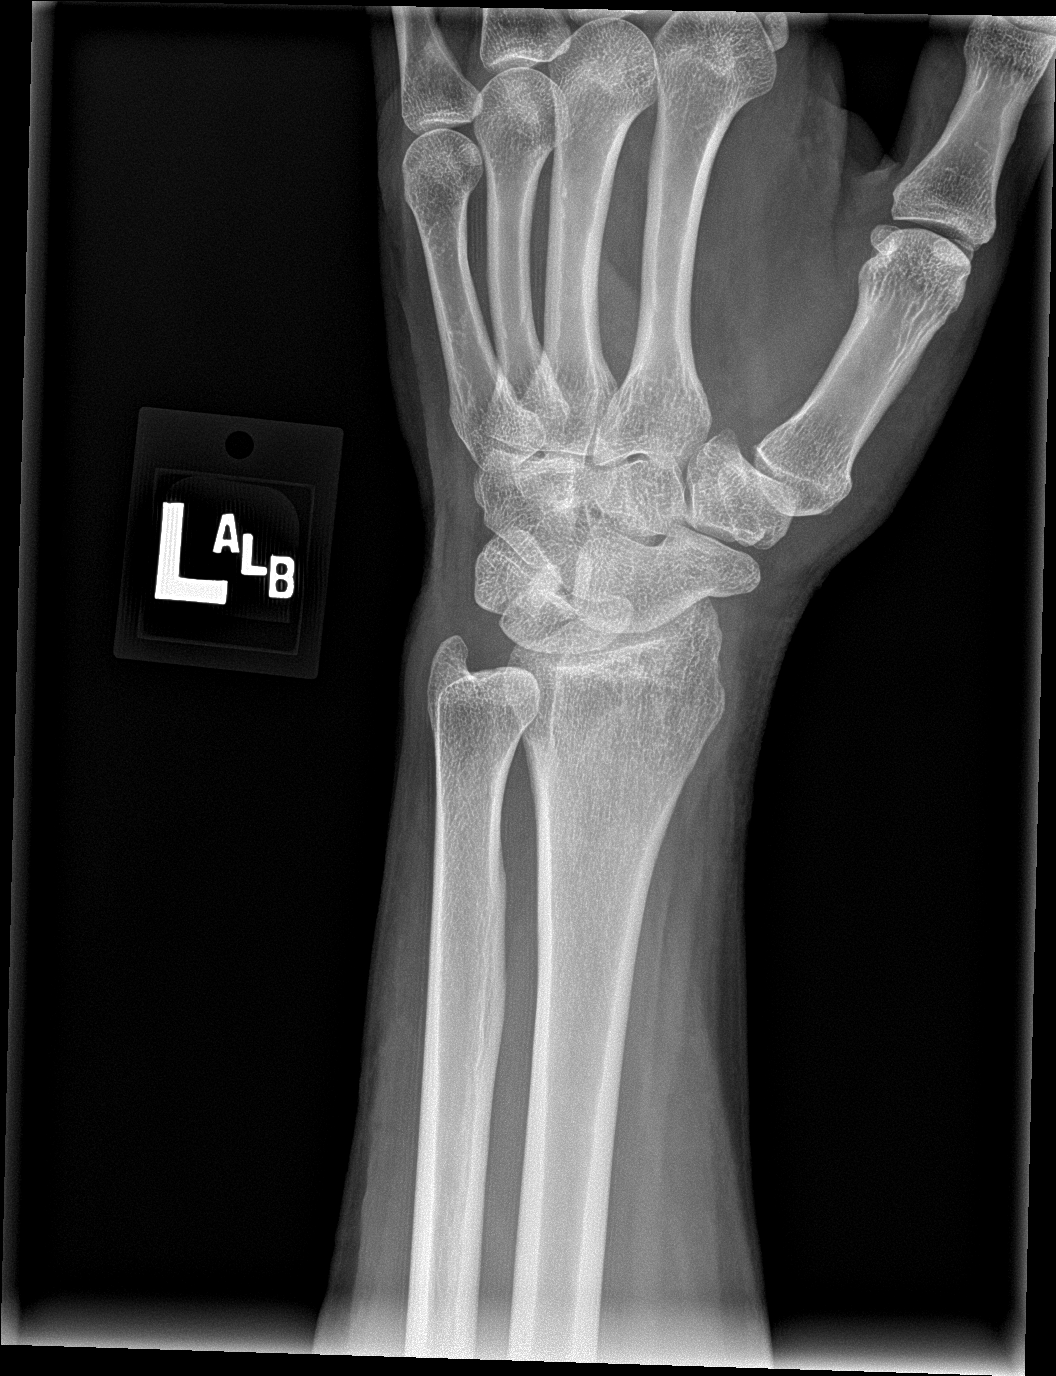

[wrist lat]
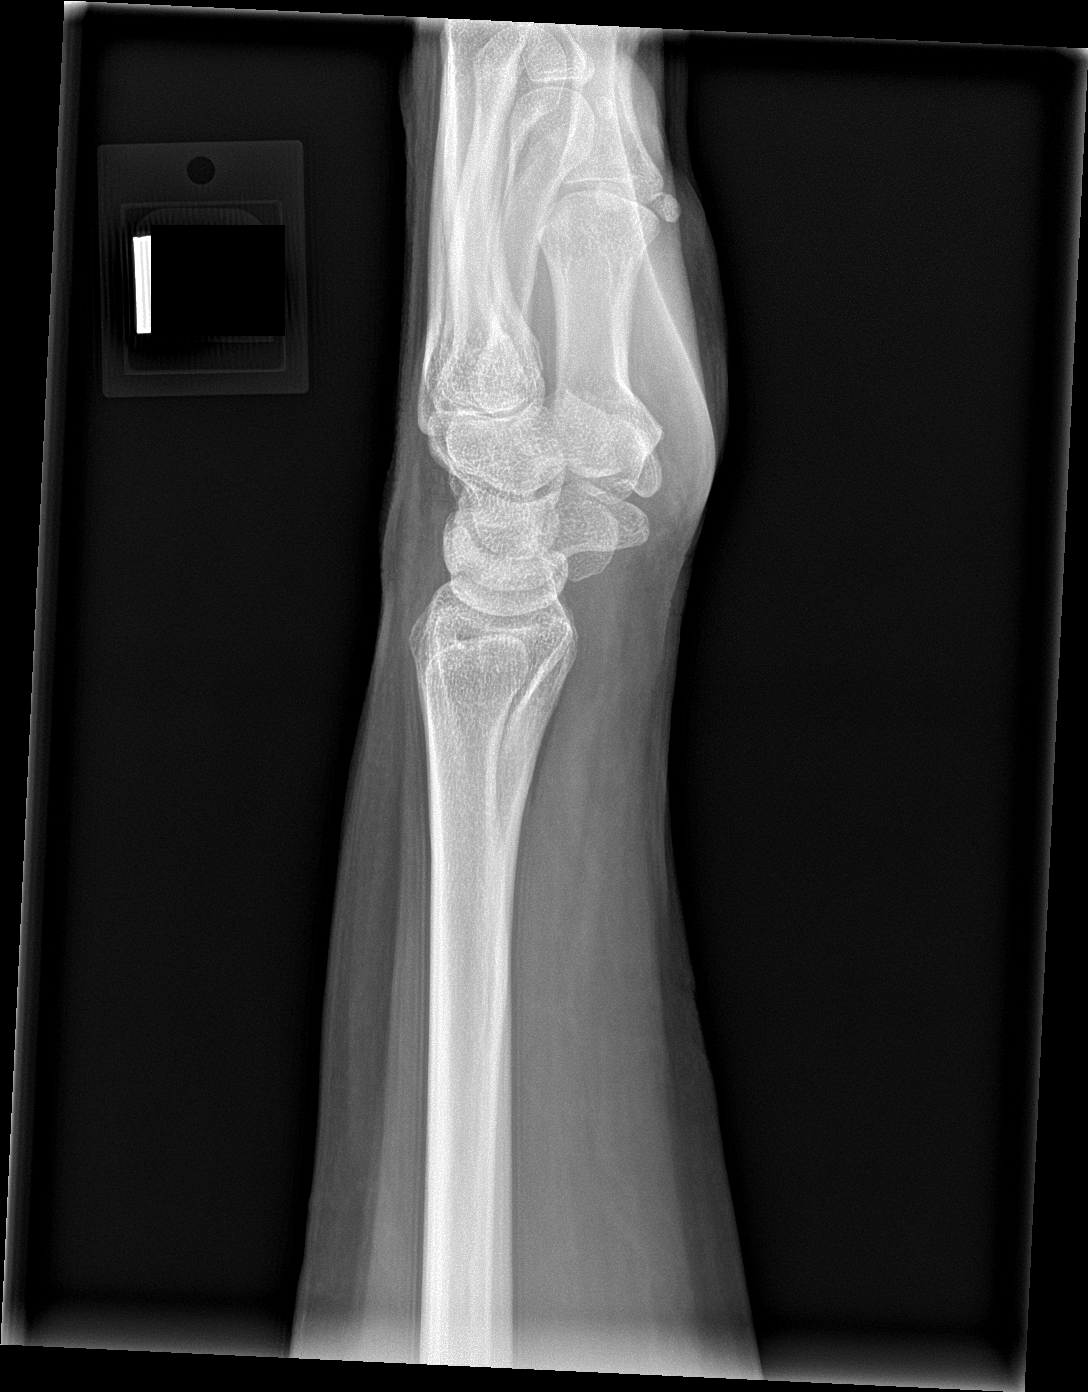

[wrist navicular]
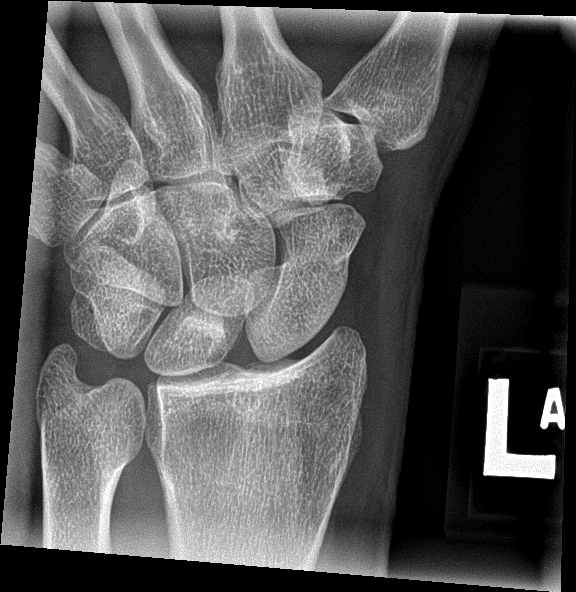

[4 of 4 positions shown; findings below may reference images not displayed]

FINDINGS: No acute fracture or dislocation. Widening of the scapholunate
interval with borderline increased scapholunate angle. Joint spaces
are preserved. Bone mineralization is normal. Soft tissues are
unremarkable.
IMPRESSION: 1. Findings suggestive of scapholunate ligament injury.

## 2022-11-27 ENCOUNTER — Encounter: Payer: Self-pay | Admitting: Family Medicine

## 2022-11-27 ENCOUNTER — Other Ambulatory Visit: Payer: Self-pay | Admitting: Family Medicine

## 2022-11-27 NOTE — Telephone Encounter (Signed)
Medication Refill - Medication: estradiol (CLIMARA - DOSED IN MG/24 HR) 0.05 mg/24hr patch   Pt stated she is out of the patches a refill was due yesterday. Pt upset when I advised her of the medication refill policy 86-76 hours.  Has the patient contacted their pharmacy? Yes.   No, more refills.   (Agent: If yes, when and what did the pharmacy advise?)  Preferred Pharmacy (with phone number or street name):  Has the patient been seen for an appointment in the last year OR  [facility] [place] [place] [facility] [place]  [address] [place] [address]  Phone: [phone] Fax: [phone]  Hours: Not open 24 hours  does the patient have an upcoming appointment? Yes.    Agent: Please be advised that RX refills may take up to 3 business days. We ask that you follow-up with your pharmacy.

## 2022-11-27 NOTE — Telephone Encounter (Signed)
Requested medication (s) are due for refill today: yes  Requested medication (s) are on the active medication list: yes  Last refill:  05/19/21  Future visit scheduled: no  Notes to clinic:  Unable to refill per protocol, last refill by another provider.      Requested Prescriptions  Pending Prescriptions Disp Refills   estradiol (CLIMARA - DOSED IN MG/24 HR) 0.05 mg/24hr patch 4 patch     Sig: Place 1 patch (0.05 mg total) onto the skin every Monday.     OB/GYN:  Estrogens Failed - 11/27/2022  1:34 PM      Failed - Mammogram is up-to-date per Health Maintenance      Passed - Last BP in normal range    BP Readings from Last 1 Encounters:  06/23/22 122/80         Passed - Valid encounter within last 12 months    Recent Outpatient Visits           5 months ago Chronic pain of both wrists   [facility] [place], Earley Abide, MD   5 months ago Tinea cruris   [facility] [place], Earley Abide, MD   6 months ago Acute rhinosinusitis   [facility] [place], Earley Abide, MD   9 months ago Annual physical exam   [facility] at [facility], Earley Abide, MD   1 year ago Cervical spondylosis with radiculopathy   [facility] at [facility], Earley Abide, MD

## 2022-11-28 ENCOUNTER — Other Ambulatory Visit: Payer: Self-pay

## 2022-11-28 DIAGNOSIS — Z760 Encounter for issue of repeat prescription: Secondary | ICD-10-CM | POA: Diagnosis present

## 2022-11-28 NOTE — ED Triage Notes (Signed)
Pt comes from home via POV wanting a medication refill. Pt wants refill on Estradiaol patch. No other complaints at this time.

## 2022-11-29 ENCOUNTER — Other Ambulatory Visit: Payer: Self-pay | Admitting: Family Medicine

## 2022-11-29 ENCOUNTER — Emergency Department
Admission: EM | Admit: 2022-11-29 | Discharge: 2022-11-29 | Disposition: A | Discharge status: Home / Self Care | Payer: BC Managed Care – PPO | Attending: Emergency Medicine | Admitting: Emergency Medicine

## 2022-11-29 DIAGNOSIS — Z760 Encounter for issue of repeat prescription: Secondary | ICD-10-CM

## 2022-11-29 MED ORDER — ESTRADIOL 0.025 MG/24HR TD PTWK: 0.0250 mg | MEDICATED_PATCH | TRANSDERMAL | 1 refills | Status: DC

## 2022-11-29 MED ORDER — ESTRADIOL 0.05 MG/24HR TD PTWK: 0.0500 mg | MEDICATED_PATCH | TRANSDERMAL | 0 refills | Status: AC

## 2022-11-29 NOTE — Telephone Encounter (Signed)
Patient called after hours nurse today - apparently very angry and rude, requesting refill on estrogen patches because GYN won't refill.  States she get panic attacks and severe anxiety without them.  Refused to answer most questions from the triage nurse, declined to go to the ED.  Was also very unclear about the pharmacy and the medication dose.  Husband was also involved in the conversation and demanded that the on call MD be contacted.  I advised thenurse that I would review the chart and if deemed appropriate, I would authorized a refill. On review of the chart, the patient was seen very early this AM at the ED and the ED physician refilled the estrogen patches.  I tried to call the on call service but was on hold for 10 minutes.  Since the patient did have an Rx available, I hung up and was unable to have the service notify the patient.

## 2022-11-29 NOTE — Discharge Instructions (Signed)
[facility]

## 2022-11-29 NOTE — ED Notes (Signed)
E-signature pad unavailable - Pt verbalized understanding of D/C information - no additional concerns at this time.  

## 2022-11-29 NOTE — ED Provider Notes (Signed)
   [facility] Provider Note    None    (approximate)   History   Medication Refill   HPI  Charlene Keith is a 56 y.o. female who presents to the ED for evaluation of Medication Refill   Patient presents to the ED requesting refill of her chronic medications of estradiol patch.  No complaints.   Physical Exam   Triage Vital Signs: ED Triage Vitals  Enc Vitals Group     BP 11/28/22 2252 129/78     Pulse Rate 11/28/22 2252 80     Resp 11/28/22 2252 20     Temp 11/28/22 2252 97.9 F (36.6 C)     Temp Source 11/28/22 2252 Oral     SpO2 11/28/22 2252 96 %     Weight --      Height --      Head Circumference --      Peak Flow --      Pain Score 11/28/22 2253 0     Pain Loc --      Pain Edu? --      Excl. in [place]? --     Most recent vital signs: Vitals:   11/28/22 2252  BP: 129/78  Pulse: 80  Resp: 20  Temp: 97.9 F (36.6 C)  SpO2: 96%    General: Awake, no distress.  Ambulatory and looks well CV:  Good peripheral perfusion.  Resp:  Normal effort.  Abd:  No distention.  MSK:  No deformity noted.  Neuro:  No focal deficits appreciated. Other:     ED Results / Procedures / Treatments   Labs (all labs ordered are listed, but only abnormal results are displayed) Labs Reviewed - No data to display  EKG   RADIOLOGY   Official radiology report(s): No results found.  PROCEDURES and INTERVENTIONS:  Procedures  Medications - No data to display   IMPRESSION / MDM / [place] / ED COURSE  I reviewed the triage vital signs and the nursing notes.  Refill sent.  No other concerning features.      FINAL CLINICAL IMPRESSION(S) / ED DIAGNOSES   Final diagnoses:  Medication refill  Encounter for medication refill     Rx / DC Orders   ED Discharge Orders          Ordered    estradiol (CLIMARA - DOSED IN MG/24 HR) 0.025 mg/24hr patch  Every 7 days,   Status:  Discontinued        11/29/22 0121    estradiol  (CLIMARA - DOSED IN MG/24 HR) 0.025 mg/24hr patch  Every 7 days        11/29/22 0124             Note:  This document was prepared using Dragon voice recognition software and may include unintentional dictation errors.   Vladimir Crofts, MD 11/29/22 [phone]

## 2022-11-29 NOTE — Telephone Encounter (Signed)
Spoke to on-call nurse regarding encounter, EMR reviewed and patient does have a Rx for 1 patch and 1 [facility]:  [id number] [place] [place] [facility] [address]([phone]  I spoke to the on-call nurse and advised her to please contact the patient to convey the above. I have also sent another 1 patch at same strength.   Advised on-call nurse to encourage patient to check-in with gynecology regarding this issue and if persistent despite restart of patch to seek follow-up with me (PCP) or ER if outside of hours.

## 2022-12-01 NOTE — Telephone Encounter (Signed)
Please advise 

## 2022-12-06 ENCOUNTER — Other Ambulatory Visit: Payer: Self-pay | Admitting: Family Medicine

## 2022-12-06 DIAGNOSIS — M62838 Other muscle spasm: Secondary | ICD-10-CM

## 2023-02-04 ENCOUNTER — Ambulatory Visit: Payer: Self-pay

## 2023-02-04 NOTE — Telephone Encounter (Signed)
     Chief Complaint: Abdominal pain, "feels like reflux." Bloating Symptoms: Above Frequency: 3 days Pertinent Negatives: Patient denies  Disposition: []$ ED /[]$ Urgent Care (no appt availability in office) / [x]$ Appointment(In office/virtual)/ []$  [place] [facility]/ []$ Home Care/ []$ Refused Recommended Disposition /[]$ [place] [facility]/ []$  Follow-up with PCP Additional Notes: Go to ED for worsening of symptoms.   Reason for Disposition  [1] MODERATE pain (e.g., interferes with normal activities) AND [2] pain comes and goes (cramps) AND [3] present > 24 hours  (Exception: Pain with Vomiting or Diarrhea - see that Guideline.)  Answer Assessment - Initial Assessment Questions 1. LOCATION: "Where does it hurt?"      Upper and lower 2. RADIATION: "Does the pain shoot anywhere else?" (e.g., chest, back)     No 3. ONSET: "When did the pain begin?" (e.g., minutes, hours or days ago)      3 days 4. SUDDEN: "Gradual or sudden onset?"     Sudden 5. PATTERN "Does the pain come and go, or is it constant?"    - If it comes and goes: "How long does it last?" "Do you have pain now?"     (Note: Comes and goes means the pain is intermittent. It goes away completely between bouts.)    - If constant: "Is it getting better, staying the same, or getting worse?"      (Note: Constant means the pain never goes away completely; most serious pain is constant and gets worse.)      Comes and goes 6. SEVERITY: "How bad is the pain?"  (e.g., Scale 1-10; mild, moderate, or severe)    - MILD (1-3): Doesn't interfere with normal activities, abdomen soft and not tender to touch.     - MODERATE (4-7): Interferes with normal activities or awakens from sleep, abdomen tender to touch.     - SEVERE (8-10): Excruciating pain, doubled over, unable to do any normal activities.       6 7. RECURRENT SYMPTOM: "Have you ever had this type of stomach pain before?" If Yes, ask: "When was the last time?" and "What happened  that time?"      Yes 8. CAUSE: "What do you think is causing the stomach pain?"     Reflux 9. RELIEVING/AGGRAVATING FACTORS: "What makes it better or worse?" (e.g., antacids, bending or twisting motion, bowel movement)     No 10. OTHER SYMPTOMS: "Do you have any other symptoms?" (e.g., back pain, diarrhea, fever, urination pain, vomiting)       Bloating 11. PREGNANCY: "Is there any chance you are pregnant?" "When was your last menstrual period?"       No  Protocols used: Abdominal Pain - [facility]

## 2023-02-04 NOTE — Telephone Encounter (Signed)
noted 

## 2023-02-05 ENCOUNTER — Ambulatory Visit: Payer: BC Managed Care – PPO | Admitting: Family Medicine

## 2023-02-05 ENCOUNTER — Encounter: Payer: Self-pay | Admitting: Family Medicine

## 2023-02-05 VITALS — BP 128/86 | HR 77 | Ht 62.5 in | Wt 179.0 lb

## 2023-02-05 DIAGNOSIS — Z23 Encounter for immunization: Secondary | ICD-10-CM | POA: Insufficient documentation

## 2023-02-05 DIAGNOSIS — K2900 Acute gastritis without bleeding: Secondary | ICD-10-CM | POA: Insufficient documentation

## 2023-02-05 MED ORDER — PANTOPRAZOLE SODIUM 40 MG PO TBEC: 40.0000 mg | DELAYED_RELEASE_TABLET | Freq: Every day | ORAL | 0 refills | Status: DC

## 2023-02-05 MED ORDER — PANTOPRAZOLE SODIUM 40 MG PO TBEC: 40.0000 mg | DELAYED_RELEASE_TABLET | Freq: Every day | ORAL | 3 refills | Status: DC

## 2023-02-05 NOTE — Progress Notes (Signed)
     Primary Care / Sports Medicine Office Visit  Patient Information:  Patient ID: Charlene Keith, female DOB: 04/04/1966 Age: 57 y.o. MRN: [id number]   Charlene Keith is a pleasant 57 y.o. female presenting with the following:  Chief Complaint  Patient presents with   Abdominal Pain    X 2 days, upper and lower, gas a lot of gas and has GERD when its time to eat, has constipation at times, hurts in palpating, nausea, burping     Vitals:   02/05/23 1041 02/05/23 1046  BP: 128/84 128/86  Pulse: 77   SpO2: 97%    Vitals:   02/05/23 1041  Weight: 179 lb (81.2 kg)  Height: 5' 2.5" (1.588 m)   Body mass index is 32.22 kg/m.  No results found.   Independent interpretation of notes and tests performed by another provider:   None  Procedures performed:   None  Pertinent History, Exam, Impression, and Recommendations:   Charlene Keith was seen today for abdominal pain.  Acute gastritis, presence of bleeding unspecified, unspecified gastritis type Assessment & Plan: Patient presents with roughly 1 week history of abdominal pain, nausea, waterbrash, constipation after spicy meal intake.  Denies any similar symptoms in family members, has been drinking electrolyte rich fluids, using OTC H2 blocker with slow response.  Examination with hypoactive bowel sounds, epigastric tenderness, no hepatosplenomegaly, oropharynx benign.  Concern for gastritis/significant associated GERD.  Plan for Protonix x 2 weeks then daily as needed, manage bowel regimen with Dulcolax and MiraLAX, focus on hydration.  She will follow-up in 1 month for reevaluation and CPE.  Orders: -     Pantoprazole Sodium; Take 1 tablet (40 mg total) by mouth daily.  Dispense: 30 tablet; Refill: 3  Need for immunization against influenza -     Flu Vaccine QUAD 78moIM (Fluarix, Fluzone & Alfiuria Quad PF)     Orders & Medications Meds ordered this encounter  Medications   pantoprazole (PROTONIX) 40 MG tablet     Sig: Take 1 tablet (40 mg total) by mouth daily.    Dispense:  30 tablet    Refill:  3   Orders Placed This Encounter  Procedures   Flu Vaccine QUAD 647moM (Fluarix, Fluzone & Alfiuria Quad PF)     Return in about 4 weeks (around 03/05/2023) for CPE.     Charlene CulverMD, [facility] Medicine [facility]

## 2023-02-05 NOTE — Assessment & Plan Note (Signed)
Patient presents with roughly 1 week history of abdominal pain, nausea, waterbrash, constipation after spicy meal intake.  Denies any similar symptoms in family members, has been drinking electrolyte rich fluids, using OTC H2 blocker with slow response.  Examination with hypoactive bowel sounds, epigastric tenderness, no hepatosplenomegaly, oropharynx benign.  Concern for gastritis/significant associated GERD.  Plan for Protonix x 2 weeks then daily as needed, manage bowel regimen with Dulcolax and MiraLAX, focus on hydration.  She will follow-up in 1 month for reevaluation and CPE.

## 2023-02-05 NOTE — Patient Instructions (Signed)
-   Dose pantoprazole daily on empty stomach x 2 weeks - Can continue for an additional 2 weeks if symptoms persist - Dose MiraLAX daily and adjust every few days to target 1-2 smooth and complete bowel movements - Can use Dulcolax if not having complete bowel movement after 1-2 days - Stop Gatorade/electrolyte drinks - Drink plenty of water - Can use Gas-X/simethicone for bloating/gas - Return for follow-up in 4 weeks for annual physical

## 2023-02-09 ENCOUNTER — Ambulatory Visit: Payer: BC Managed Care – PPO | Admitting: Family Medicine

## 2023-04-01 ENCOUNTER — Encounter: Payer: Self-pay | Admitting: Family Medicine

## 2023-04-01 ENCOUNTER — Ambulatory Visit (INDEPENDENT_AMBULATORY_CARE_PROVIDER_SITE_OTHER): Payer: BC Managed Care – PPO | Admitting: Family Medicine

## 2023-04-01 VITALS — BP 122/78 | HR 98 | Ht 62.5 in | Wt 178.0 lb

## 2023-04-01 DIAGNOSIS — E559 Vitamin D deficiency, unspecified: Secondary | ICD-10-CM | POA: Diagnosis not present

## 2023-04-01 DIAGNOSIS — G4733 Obstructive sleep apnea (adult) (pediatric): Secondary | ICD-10-CM

## 2023-04-01 DIAGNOSIS — Z13220 Encounter for screening for lipoid disorders: Secondary | ICD-10-CM | POA: Diagnosis not present

## 2023-04-01 DIAGNOSIS — K5909 Other constipation: Secondary | ICD-10-CM | POA: Diagnosis not present

## 2023-04-01 DIAGNOSIS — Z1211 Encounter for screening for malignant neoplasm of colon: Secondary | ICD-10-CM

## 2023-04-01 DIAGNOSIS — Z0000 Encounter for general adult medical examination without abnormal findings: Secondary | ICD-10-CM | POA: Diagnosis not present

## 2023-04-01 DIAGNOSIS — K5904 Chronic idiopathic constipation: Secondary | ICD-10-CM

## 2023-04-01 DIAGNOSIS — M4722 Other spondylosis with radiculopathy, cervical region: Secondary | ICD-10-CM

## 2023-04-01 NOTE — Progress Notes (Signed)
Annual Physical Exam Visit  Patient Information:  Patient ID: Charlene Keith, female DOB: [birth year] Age: 57 y.o. MRN: [id number]   Subjective:   CC: Annual Physical Exam  HPI:  Charlene Keith is here for their annual physical.  I reviewed the past medical history, family history, social history, surgical history, and allergies today and changes were made as necessary.  Please see the problem list section below for additional details.  Past Medical History: Past Medical History:  Diagnosis Date   Left breast mass 2023   benign x 2   Ovarian cyst    Past Surgical History: Past Surgical History:  Procedure Laterality Date   ABDOMINAL HYSTERECTOMY  2013   Cervix removed - [facility] [place], [place])   BREAST LUMPECTOMY WITH RADIOACTIVE SEED LOCALIZATION Left 01/02/2022   Procedure: LEFT BREAST LUMPECTOMY WITH RADIOACTIVE SEED LOCALIZATION;  Surgeon: Abigail Miyamoto, MD;  Location: [facility] OR;  Service: General;  Laterality: Left;   COLONOSCOPY  2014   NOSE SURGERY  1990   WISDOM TOOTH EXTRACTION Bilateral 2010   Family History: Family History  Problem Relation Age of Onset   Hypertension Mother    Lymphoma Mother    Healthy Father    Allergies: Allergies  Allergen Reactions   Penicillins Rash   Health Maintenance: Health Maintenance  Topic Date Due   COVID-19 Vaccine (4 - 2023-24 season) 08/07/2022   MAMMOGRAM  10/28/2022   INFLUENZA VACCINE  07/08/2023   DTaP/Tdap/Td (3 - Td or Tdap) 08/27/2024   COLONOSCOPY (Pts 45-86yrs Insurance coverage will need to be confirmed)  12/31/2027   Zoster Vaccines- Shingrix  Completed   HPV VACCINES  Aged Out   Hepatitis C Screening  Discontinued   HIV Screening  Discontinued    [facility] Colonoscopy          COLONOSCOPY (Pts 45-20yrs Insurance coverage will need to be confirmed) (Every 10 Years) Next due on 12/31/2027    12/30/2017  [facility] Colonoscopy component of [facility] COLONOSCOPY           Medications: Current  Outpatient Medications on File Prior to Visit  Medication Sig Dispense Refill   acetaminophen (TYLENOL) 325 MG tablet Take 162.5 mg by mouth every 6 (six) hours as needed for moderate pain.     cetirizine (ZYRTEC) 10 MG tablet Take 10 mg by mouth daily as needed for allergies.     Cyanocobalamin (B-12 PO) Take 1 capsule by mouth daily.     cyclobenzaprine (FLEXERIL) 5 MG tablet Take 1-2 tablets (5-10 mg total) by mouth at bedtime as needed for muscle spasms. One half to one tab PO qHS, then increase gradually to one tab TID. (Patient taking differently: Take 5 mg by mouth at bedtime as needed for muscle spasms.) 60 tablet 0   estradiol (CLIMARA - DOSED IN MG/24 HR) 0.05 mg/24hr patch Place 1 patch (0.05 mg total) onto the skin every Monday. 1 patch 0   famotidine (PEPCID) 20 MG tablet Take 20 mg by mouth daily as needed for heartburn or indigestion.     hydrocortisone (ANUSOL-HC) 2.5 % rectal cream Place 1 application. rectally 2 (two) times daily. 30 g 1   Magnesium 500 MG TABS Take 500 mg by mouth daily.     meloxicam (MOBIC) 15 MG tablet TAKE 1 TABLET BY MOUTH EVERY DAY AS NEEDED FOR PAIN 30 tablet 2   pantoprazole (PROTONIX) 40 MG tablet Take 1 tablet (40 mg total) by mouth daily. 30 tablet 0  tretinoin (RETIN-A) 0.025 % cream Apply 1 application topically at bedtime.     VITAMIN D PO Take 1 capsule by mouth daily.     No current facility-administered medications on file prior to visit.    Review of Systems: No headache, visual changes, nausea, vomiting, diarrhea, constipation, dizziness, abdominal pain, skin rash, fevers, chills, night sweats, swollen lymph nodes, weight loss, chest pain, body aches, joint swelling, muscle aches, shortness of breath, mood changes, visual or auditory hallucinations reported.  Objective:   Vitals:   04/01/23 0811  BP: 122/78  Pulse: 98  SpO2: 98%   Vitals:   04/01/23 0811  Weight: 178 lb (80.7 kg)  Height: 5' 2.5" (1.588 m)   Body mass index is  32.04 kg/m.  General: Well Developed, well nourished, and in no acute distress.  Neuro: Alert and oriented x3, extra-ocular muscles intact, sensation grossly intact. Cranial nerves II through XII are grossly intact, motor, sensory, and coordinative functions are intact. HEENT: Normocephalic, atraumatic, pupils equal round reactive to light, neck supple, no masses, no lymphadenopathy, thyroid nonpalpable. Oropharynx, nasopharynx, external ear canals are unremarkable. Skin: Warm and dry, no rashes noted.  Cardiac: Regular rate and rhythm, no murmurs rubs or gallops. No peripheral edema. Pulses symmetric. Respiratory: Clear to auscultation bilaterally. Not using accessory muscles, speaking in full sentences.  Abdominal: Soft, nontender, nondistended, positive bowel sounds, no masses, no organomegaly. Musculoskeletal: Shoulder, elbow, wrist, hip, knee, ankle stable, and with full range of motion.  Female chaperone initials: Ottis Stain present throughout the physical examination.  Impression and Recommendations:   The patient was counselled, risk factors were discussed, and anticipatory guidance given.  Problem List Items Addressed This Visit       Respiratory   OSA on CPAP    Chronic, stable        Digestive   Chronic idiopathic constipation    In the setting of previously noted hemorrhoids, doses MiraLAX regularly and still symptomatic.  -Referral to gastroenterology for further evaluation/management - Patient is due for screening colonoscopy as well        Nervous and Auditory   Cervical spondylosis with radiculopathy    Chronic, follows with pain and spine group.        Other   Healthcare maintenance - Primary    Annual examination completed, risk stratification labs ordered, anticipatory guidance provided.  We will follow labs once resulted.      Relevant Orders   CBC   Comprehensive metabolic panel   Lipid panel   TSH   VITAMIN D 25 Hydroxy (Vit-D Deficiency, Fractures)    Other Visit Diagnoses     Vitamin D deficiency       Relevant Orders   VITAMIN D 25 Hydroxy (Vit-D Deficiency, Fractures)   Screening for lipoid disorders       Relevant Orders   Comprehensive metabolic panel   Lipid panel   Chronic constipation       Relevant Orders   Ambulatory referral to Gastroenterology   Colon cancer screening       Relevant Orders   Ambulatory referral to Gastroenterology        Orders & Medications Medications: No orders of the defined types were placed in this encounter.  Orders Placed This Encounter  Procedures   CBC   Comprehensive metabolic panel   Lipid panel   TSH   VITAMIN D 25 Hydroxy (Vit-D Deficiency, Fractures)   Ambulatory referral to Gastroenterology     Return in about 3 months (around  07/01/2023).    Jerrol Banana, MD, [facility]   Primary Care Sports Medicine Primary Care and Sports Medicine at [facility]

## 2023-04-01 NOTE — Assessment & Plan Note (Signed)
Annual examination completed, risk stratification labs ordered, anticipatory guidance provided.  We will follow labs once resulted. 

## 2023-04-01 NOTE — Assessment & Plan Note (Signed)
Chronic, follows with pain and spine group.

## 2023-04-01 NOTE — Patient Instructions (Signed)
-   Obtain fasting labs with orders provided (can have water or black coffee but otherwise no food or drink x 8 hours before labs) - Review information provided - Attend eye doctor annually, dentist every 6 months, work towards or maintain 30 minutes of moderate intensity physical activity at least 5 days per week, and consume a balanced diet - Return in 3 months - Contact us for any questions between now and then 

## 2023-04-01 NOTE — Assessment & Plan Note (Signed)
In the setting of previously noted hemorrhoids, doses MiraLAX regularly and still symptomatic.  -Referral to gastroenterology for further evaluation/management - Patient is due for screening colonoscopy as well

## 2023-04-01 NOTE — Assessment & Plan Note (Signed)
Chronic, stable 

## 2023-04-02 LAB — COMPREHENSIVE METABOLIC PANEL
ALT: 17 IU/L (ref 0–32)
AST: 18 IU/L (ref 0–40)
Albumin/Globulin Ratio: 1.5 (ref 1.2–2.2)
Albumin: 4.2 g/dL (ref 3.8–4.9)
Alkaline Phosphatase: 84 IU/L (ref 44–121)
BUN/Creatinine Ratio: 12 (ref 9–23)
BUN: 10 mg/dL (ref 6–24)
Bilirubin Total: 0.5 mg/dL (ref 0.0–1.2)
CO2: 23 mmol/L (ref 20–29)
Calcium: 9.7 mg/dL (ref 8.7–10.2)
Chloride: 102 mmol/L (ref 96–106)
Creatinine, Ser: 0.83 mg/dL (ref 0.57–1.00)
Globulin, Total: 2.8 g/dL (ref 1.5–4.5)
Glucose: 95 mg/dL (ref 70–99)
Potassium: 4.3 mmol/L (ref 3.5–5.2)
Sodium: 139 mmol/L (ref 134–144)
Total Protein: 7 g/dL (ref 6.0–8.5)
eGFR: 83 mL/min/{1.73_m2} (ref >59)

## 2023-04-02 LAB — LIPID PANEL
Chol/HDL Ratio: 3.3 ratio (ref 0.0–4.4)
Cholesterol, Total: 208 mg/dL — ABNORMAL HIGH (ref 100–199)
HDL: 64 mg/dL (ref >39)
LDL Chol Calc (NIH): 128 mg/dL — ABNORMAL HIGH (ref 0–99)
Triglycerides: 92 mg/dL (ref 0–149)
VLDL Cholesterol Cal: 16 mg/dL (ref 5–40)

## 2023-04-02 LAB — CBC
Hematocrit: 40.8 % (ref 34.0–46.6)
Hemoglobin: 13.4 g/dL (ref 11.1–15.9)
MCH: 28.9 pg (ref 26.6–33.0)
MCHC: 32.8 g/dL (ref 31.5–35.7)
MCV: 88 fL (ref 79–97)
Platelets: 430 10*3/uL (ref 150–450)
RBC: 4.63 x10E6/uL (ref 3.77–5.28)
RDW: 12.9 % (ref 11.7–15.4)
WBC: 6.5 10*3/uL (ref 3.4–10.8)

## 2023-04-02 LAB — TSH: TSH: 2.84 u[IU]/mL (ref 0.450–4.500)

## 2023-04-02 LAB — VITAMIN D 25 HYDROXY (VIT D DEFICIENCY, FRACTURES): Vit D, 25-Hydroxy: 31.9 ng/mL (ref 30.0–100.0)

## 2023-05-07 ENCOUNTER — Other Ambulatory Visit: Payer: Self-pay | Admitting: Family Medicine

## 2023-05-07 DIAGNOSIS — K2900 Acute gastritis without bleeding: Secondary | ICD-10-CM

## 2023-05-07 NOTE — Telephone Encounter (Signed)
Requested Prescriptions  Pending Prescriptions Disp Refills   pantoprazole (PROTONIX) 40 MG tablet [Pharmacy Med Name: PANTOPRAZOLE SOD DR 40 MG TAB] 90 tablet 3    Sig: TAKE 1 TABLET BY MOUTH EVERY DAY     Gastroenterology: Proton Pump Inhibitors Passed - 05/07/2023  3:00 AM      Passed - Valid encounter within last 12 months    Recent Outpatient Visits           1 month ago Healthcare maintenance   [place] [facility] at [facility] Ashley Royalty, Ocie Bob, MD   3 months ago Acute gastritis, presence of bleeding unspecified, unspecified gastritis type   [facility], Ocie Bob, MD   10 months ago Chronic pain of both wrists   [facility] at [facility], Ocie Bob, MD   10 months ago Tinea cruris   [place] [facility] at [facility], Ocie Bob, MD   11 months ago Acute rhinosinusitis   [place] [facility] at [facility], Ocie Bob, MD       Future Appointments             In 1 month Ashley Royalty, Ocie Bob, MD [facility] & [facility] Medicine at [facility], [facility]   In 11 months Ashley Royalty, Ocie Bob, MD [facility] at [facility], [facility]

## 2023-07-01 ENCOUNTER — Ambulatory Visit: Payer: BC Managed Care – PPO | Admitting: Family Medicine

## 2023-07-01 ENCOUNTER — Encounter: Payer: Self-pay | Admitting: Family Medicine

## 2023-07-01 VITALS — BP 122/78 | HR 72 | Ht 62.5 in | Wt 177.0 lb

## 2023-07-01 DIAGNOSIS — K5909 Other constipation: Secondary | ICD-10-CM | POA: Diagnosis not present

## 2023-07-01 DIAGNOSIS — M4722 Other spondylosis with radiculopathy, cervical region: Secondary | ICD-10-CM

## 2023-07-01 DIAGNOSIS — Z1211 Encounter for screening for malignant neoplasm of colon: Secondary | ICD-10-CM | POA: Diagnosis not present

## 2023-07-01 DIAGNOSIS — K5904 Chronic idiopathic constipation: Secondary | ICD-10-CM

## 2023-07-01 DIAGNOSIS — Z1231 Encounter for screening mammogram for malignant neoplasm of breast: Secondary | ICD-10-CM

## 2023-07-01 MED ORDER — DICLOFENAC POTASSIUM 50 MG PO TABS
50.0000 mg | ORAL_TABLET | Freq: Three times a day (TID) | ORAL | 0 refills | Status: DC | PRN
Start: 2023-07-01 — End: 2024-06-08

## 2023-07-01 MED ORDER — TRAMADOL HCL 50 MG PO TABS
50.0000 mg | ORAL_TABLET | Freq: Four times a day (QID) | ORAL | 0 refills | Status: AC | PRN
Start: 2023-07-01 — End: 2023-07-06

## 2023-07-01 NOTE — Patient Instructions (Addendum)
-   Contact spine group for follow-up - Dose diclofenac as-needed with food - Can use tramadol sparingly for severe breakthrough pain  Please call [facility] to schedule an appointment regarding constipation and colon cancer screening: [phone]

## 2023-07-02 ENCOUNTER — Encounter: Payer: Self-pay | Admitting: Family Medicine

## 2023-07-02 NOTE — Assessment & Plan Note (Signed)
Dosing Miralax with some benefit though still symptomatic. Was not able to schedule with GI due to scheduler.   - Patient to contact clinic directly for both chronic constipation evaluation and colon cancer screening review

## 2023-07-02 NOTE — Progress Notes (Signed)
     Primary Care / Sports Medicine Office Visit  Patient Information:  Patient ID: Charlene Keith, female DOB: 1966-10-21 Age: 57 y.o. MRN: [id number]   Charlene Keith is a pleasant 57 y.o. female presenting with the following:  Chief Complaint  Patient presents with   Gastroesophageal Reflux    Better now, and no longer using acid reflux meds unless she has to. PRN.    Vitals:   07/01/23 0811  BP: 122/78  Pulse: 72  SpO2: 98%   Vitals:   07/01/23 0811  Weight: 177 lb (80.3 kg)  Height: 5' 2.5" (1.588 m)   Body mass index is 31.86 kg/m.  No results found.   Independent interpretation of notes and tests performed by another provider:   None  Procedures performed:   None  Pertinent History, Exam, Impression, and Recommendations:   Charlene Keith was seen today for gastroesophageal reflux.  Cervical spondylosis with radiculopathy Assessment & Plan: Recurrent symptomatology since 02/18/2022 interventional spine management. No new or differing symptoms.  Plan as follows: - Contact spine group for follow-up - Dose diclofenac as-needed with food - Can use tramadol sparingly for severe breakthrough pain  Orders: -     Diclofenac Potassium; Take 1 tablet (50 mg total) by mouth 3 (three) times daily as needed.  Dispense: 90 tablet; Refill: 0 -     traMADol HCl; Take 1 tablet (50 mg total) by mouth every 6 (six) hours as needed for up to 5 days.  Dispense: 20 tablet; Refill: 0  Chronic idiopathic constipation Assessment & Plan: Dosing Miralax with some benefit though still symptomatic. Was not able to schedule with GI due to scheduler.   - Patient to contact clinic directly for both chronic constipation evaluation and colon cancer screening review  Orders: -     Ambulatory referral to Gastroenterology  Chronic constipation -     Ambulatory referral to Gastroenterology  Colon cancer screening -     Ambulatory referral to Gastroenterology  Breast cancer screening by  mammogram -     Digital Screening Mammogram, Left and Right; Future     Orders & Medications Meds ordered this encounter  Medications   diclofenac (CATAFLAM) 50 MG tablet    Sig: Take 1 tablet (50 mg total) by mouth 3 (three) times daily as needed.    Dispense:  90 tablet    Refill:  0   traMADol (ULTRAM) 50 MG tablet    Sig: Take 1 tablet (50 mg total) by mouth every 6 (six) hours as needed for up to 5 days.    Dispense:  20 tablet    Refill:  0   Orders Placed This Encounter  Procedures   MM Digital Screening   Ambulatory referral to Gastroenterology     No follow-ups on file.     Jerrol Banana, MD, [facility]   Primary Care Sports Medicine Primary Care and Sports Medicine at [facility]

## 2023-07-02 NOTE — Assessment & Plan Note (Signed)
Recurrent symptomatology since 02/18/2022 interventional spine management. No new or differing symptoms.  Plan as follows: - Contact spine group for follow-up - Dose diclofenac as-needed with food - Can use tramadol sparingly for severe breakthrough pain

## 2024-04-03 ENCOUNTER — Ambulatory Visit (INDEPENDENT_AMBULATORY_CARE_PROVIDER_SITE_OTHER): Payer: Self-pay | Admitting: Family Medicine

## 2024-04-03 ENCOUNTER — Encounter: Payer: Self-pay | Admitting: Family Medicine

## 2024-04-03 VITALS — BP 116/80 | HR 69 | Ht 62.5 in | Wt 180.0 lb

## 2024-04-03 DIAGNOSIS — Z136 Encounter for screening for cardiovascular disorders: Secondary | ICD-10-CM

## 2024-04-03 DIAGNOSIS — S76019A Strain of muscle, fascia and tendon of unspecified hip, initial encounter: Secondary | ICD-10-CM

## 2024-04-03 DIAGNOSIS — K649 Unspecified hemorrhoids: Secondary | ICD-10-CM | POA: Diagnosis not present

## 2024-04-03 DIAGNOSIS — Z0000 Encounter for general adult medical examination without abnormal findings: Secondary | ICD-10-CM | POA: Diagnosis not present

## 2024-04-03 DIAGNOSIS — K5904 Chronic idiopathic constipation: Secondary | ICD-10-CM

## 2024-04-03 DIAGNOSIS — R7309 Other abnormal glucose: Secondary | ICD-10-CM | POA: Diagnosis not present

## 2024-04-03 DIAGNOSIS — H938X3 Other specified disorders of ear, bilateral: Secondary | ICD-10-CM

## 2024-04-03 MED ORDER — HYDROCORTISONE (PERIANAL) 2.5 % EX CREA
1.0000 | TOPICAL_CREAM | Freq: Two times a day (BID) | CUTANEOUS | 1 refills | Status: AC
Start: 2024-04-03 — End: ?

## 2024-04-03 NOTE — Assessment & Plan Note (Signed)
 She has a history of constipation, which she attributes to inadequate water intake despite increased physical activity. She is currently experiencing hemorrhoids, which she connects to straining due to constipation and requires medication.  Hemorrhoids Recurrent hemorrhoids likely linked to constipation and straining. - Prescribe hemorrhoid cream and send to pharmacy.  Use 1 application twice daily until resolved.

## 2024-04-03 NOTE — Assessment & Plan Note (Signed)
 See additional assessment(s) for plan details.

## 2024-04-03 NOTE — Assessment & Plan Note (Signed)
 Ear fullness bilaterally Bilateral tinnitus with congestion. Discussed Flonase to alleviate symptoms and avoiding antihistamines due to potential exacerbation of constipation. - Start Flonase, two sprays each nostril daily, to reduce congestion. - Use saline spray to maintain nasal moisture. - Consider Claritin or Zyrtec if symptoms persist despite Flonase and saline spray.

## 2024-04-03 NOTE — Progress Notes (Signed)
 Annual Physical Exam Visit  Patient Information:  Patient ID: Charlene Keith, female DOB: [birth year] Age: 58 y.o. MRN: [id number]   Subjective:   CC: Annual Physical Exam  HPI:  Charlene Keith is here for their annual physical.  I reviewed the past medical history, family history, social history, surgical history, and allergies today and changes were made as necessary.  Please see the problem list section below for additional details.  Past Medical History: Past Medical History:  Diagnosis Date   Left breast mass 2023   benign x 2   Ovarian cyst    Pain of left sacroiliac joint 05/19/2021   Past Surgical History: Past Surgical History:  Procedure Laterality Date   ABDOMINAL HYSTERECTOMY  2013   Cervix removed - [facility] [place], [place])   BREAST LUMPECTOMY WITH RADIOACTIVE SEED LOCALIZATION Left 01/02/2022   Procedure: LEFT BREAST LUMPECTOMY WITH RADIOACTIVE SEED LOCALIZATION;  Surgeon: Oza Blumenthal, MD;  Location: [facility] OR;  Service: General;  Laterality: Left;   COLONOSCOPY  2014   NOSE SURGERY  1990   WISDOM TOOTH EXTRACTION Bilateral 2010   Family History: Family History  Problem Relation Age of Onset   Hypertension Mother    Lymphoma Mother    Healthy Father    Allergies: Allergies  Allergen Reactions   Penicillins Rash   Health Maintenance: Health Maintenance  Topic Date Due   MAMMOGRAM  10/28/2022   COVID-19 Vaccine (4 - 2024-25 season) 04/19/2024 (Originally 08/08/2023)   INFLUENZA VACCINE  07/07/2024   DTaP/Tdap/Td (3 - Td or Tdap) 08/27/2024   Colonoscopy  12/31/2027   Zoster Vaccines- Shingrix   Completed   HPV VACCINES  Aged Out   Meningococcal B Vaccine  Aged Out   Hepatitis C Screening  Discontinued   HIV Screening  Discontinued    [facility] Colonoscopy          Upcoming     Colonoscopy (Every 10 Years) Next due on 12/31/2027    12/30/2017  [facility] Colonoscopy component of [facility] COLONOSCOPY   Only the first 1 history entries have  been loaded, but more history exists.               Medications: Current Outpatient Medications on File Prior to Visit  Medication Sig Dispense Refill   acetaminophen  (TYLENOL ) 325 MG tablet Take 162.5 mg by mouth every 6 (six) hours as needed for moderate pain.     cetirizine (ZYRTEC) 10 MG tablet Take 10 mg by mouth daily as needed for allergies.     Cyanocobalamin (B-12 PO) Take 1 capsule by mouth daily.     estradiol  (CLIMARA  - DOSED IN MG/24 HR) 0.05 mg/24hr patch Place 1 patch (0.05 mg total) onto the skin every Monday. 1 patch 0   tretinoin (RETIN-A) 0.025 % cream Apply 1 application topically at bedtime.     VITAMIN D  PO Take 1 capsule by mouth daily.     cyclobenzaprine  (FLEXERIL ) 5 MG tablet Take 1-2 tablets (5-10 mg total) by mouth at bedtime as needed for muscle spasms. One half to one tab PO qHS, then increase gradually to one tab TID. (Patient not taking: Reported on 04/03/2024) 60 tablet 0   diclofenac  (CATAFLAM ) 50 MG tablet Take 1 tablet (50 mg total) by mouth 3 (three) times daily as needed. (Patient not taking: Reported on 04/03/2024) 90 tablet 0   famotidine (PEPCID) 20 MG tablet Take 20 mg by mouth daily as needed for heartburn or indigestion. (Patient not taking: Reported  on 04/03/2024)     Magnesium 500 MG TABS Take 500 mg by mouth daily. (Patient not taking: Reported on 04/03/2024)     pantoprazole  (PROTONIX ) 40 MG tablet TAKE 1 TABLET BY MOUTH EVERY DAY (Patient not taking: Reported on 04/03/2024) 90 tablet 3   No current facility-administered medications on file prior to visit.    Objective:   Vitals:   04/03/24 0807  BP: 116/80  Pulse: 69  SpO2: 99%   Vitals:   04/03/24 0807  Weight: 180 lb (81.6 kg)  Height: 5' 2.5" (1.588 m)   Body mass index is 32.4 kg/m.  General: Well Developed, well nourished, and in no acute distress.  Neuro: Alert and oriented x3, extra-ocular muscles intact, sensation grossly intact. Cranial nerves II through XII are grossly  intact, motor, sensory, and coordinative functions are intact. HEENT: Normocephalic, atraumatic, neck supple, no masses, no lymphadenopathy, thyroid nonenlarged. Oropharynx, nasopharynx with faint erythema, external ear canals are unremarkable.  Bilateral TMs clear, right canal mildly erythematous. Skin: Warm and dry, no rashes noted.  Cardiac: Regular rate and rhythm, no murmurs rubs or gallops. No peripheral edema. Pulses symmetric. Respiratory: Clear to auscultation bilaterally. Speaking in full sentences.  Abdominal: Soft, nontender, nondistended, positive bowel sounds, no masses, no organomegaly. Musculoskeletal: Stable, and with full range of motion.  Impression and Recommendations:   The patient was counselled, risk factors were discussed, and anticipatory guidance given.  Problem List Items Addressed This Visit     Chronic idiopathic constipation       See additional assessment(s) for plan details.     Relevant Orders   Ambulatory referral to Gastroenterology   Ear congestion, bilateral   Ear fullness bilaterally Bilateral tinnitus with congestion. Discussed Flonase to alleviate symptoms and avoiding antihistamines due to potential exacerbation of constipation. - Start Flonase, two sprays each nostril daily, to reduce congestion. - Use saline spray to maintain nasal moisture. - Consider Claritin or Zyrtec if symptoms persist despite Flonase and saline spray.      Healthcare maintenance - Primary   Annual examination completed, risk stratification labs ordered, anticipatory guidance provided.  We will follow labs once resulted.      Relevant Orders   CBC   Hemorrhoids   She has a history of constipation, which she attributes to inadequate water intake despite increased physical activity. She is currently experiencing hemorrhoids, which she connects to straining due to constipation and requires medication.  Hemorrhoids Recurrent hemorrhoids likely linked to constipation  and straining. - Prescribe hemorrhoid cream and send to pharmacy.  Use 1 application twice daily until resolved.      Relevant Medications   hydrocortisone  (ANUSOL -HC) 2.5 % rectal cream   Other Relevant Orders   Ambulatory referral to Gastroenterology   Strain of hip   She has been experiencing pain in the front of her thigh since she began working out regularly, approximately four to five days a week, starting March 1st. The pain is associated with her exercise routine, particularly during Pilates sessions.She associates with muscle activity during workouts. She plans to transition to more weight strength training by August.  Hip flexor strain Intermittent proximal bilateral anterior thigh pain likely due to muscle trigger points from increased physical activity, particularly Pilates. Differential includes quadriceps strain and lipoma, though lipoma is less likely due to asymmetry and pain. Emphasized flexibility alongside strength training to prevent muscle damage. - Provide quadriceps strain exercises for stretching and flexibility. - Advise additional stretching of anterior thigh muscles. - Evaluate further  if no improvement, considering medications, physical therapy, imaging, or injections.      Other Visit Diagnoses       Screening for cardiovascular condition       Relevant Orders   Comprehensive metabolic panel with GFR   Lipid panel     Abnormal glucose       Relevant Orders   Hemoglobin A1c        Orders & Medications Medications:  Meds ordered this encounter  Medications   hydrocortisone  (ANUSOL -HC) 2.5 % rectal cream    Sig: Place 1 Application rectally 2 (two) times daily.    Dispense:  30 g    Refill:  1   Orders Placed This Encounter  Procedures   CBC   Comprehensive metabolic panel with GFR   Hemoglobin A1c   Lipid panel   Ambulatory referral to Gastroenterology     Return in about 1 year (around 04/03/2025) for CPE.    Ma Saupe, MD,  [facility]   Primary Care Sports Medicine Primary Care and Sports Medicine at [facility]

## 2024-04-03 NOTE — Assessment & Plan Note (Signed)
 Annual examination completed, risk stratification labs ordered, anticipatory guidance provided.  We will follow labs once resulted.

## 2024-04-03 NOTE — Assessment & Plan Note (Signed)
 She has been experiencing pain in the front of her thigh since she began working out regularly, approximately four to five days a week, starting March 1st. The pain is associated with her exercise routine, particularly during Pilates sessions.She associates with muscle activity during workouts. She plans to transition to more weight strength training by August.  Hip flexor strain Intermittent proximal bilateral anterior thigh pain likely due to muscle trigger points from increased physical activity, particularly Pilates. Differential includes quadriceps strain and lipoma, though lipoma is less likely due to asymmetry and pain. Emphasized flexibility alongside strength training to prevent muscle damage. - Provide quadriceps strain exercises for stretching and flexibility. - Advise additional stretching of anterior thigh muscles. - Evaluate further if no improvement, considering medications, physical therapy, imaging, or injections.

## 2024-04-03 NOTE — Patient Instructions (Addendum)
-   Obtain fasting labs with orders provided (can have water or black coffee but otherwise no food or drink x 8 hours before labs) - Review information provided - Attend eye doctor annually, dentist every 6 months, work towards or maintain 30 minutes of moderate intensity physical activity at least 5 days per week, and consume a balanced diet - Return in 1 year for physical - Contact us  for any questions between now and then   Patient Plan  Chronic Idiopathic Constipation: - You have been referred to a gastroenterologist for further evaluation.  Ear Congestion: 1. Start Flonase, two sprays in each nostril daily, to reduce congestion. 2. Use saline spray to keep nasal passages moist. 3. Consider taking Claritin or Zyrtec if symptoms persist despite Flonase and saline spray.  Hemorrhoids: 1. Use prescribed hemorrhoid cream (hydrocortisone  2.5%) twice daily until resolved. 2. Increase water intake to help with constipation and reduce straining.  Hip Flexor Strain: 1. Perform quadriceps stretching exercises regularly.  See separate MyChart message with exercises. 2. Include additional stretching of the front thigh muscles. 3. If pain does not improve, seek further evaluation for possible medications, physical therapy, imaging, or injections.  Red Flags: - Contact your healthcare provider if you experience worsening symptoms or new concerns regarding any of the above conditions.

## 2024-05-02 ENCOUNTER — Ambulatory Visit: Payer: Self-pay

## 2024-05-02 ENCOUNTER — Ambulatory Visit: Admitting: Family Medicine

## 2024-05-02 ENCOUNTER — Encounter: Payer: Self-pay | Admitting: Family Medicine

## 2024-05-02 VITALS — BP 90/68 | HR 95 | Ht 62.5 in | Wt 177.0 lb

## 2024-05-02 DIAGNOSIS — M546 Pain in thoracic spine: Secondary | ICD-10-CM | POA: Insufficient documentation

## 2024-05-02 MED ORDER — PREDNISONE 20 MG PO TABS: 20.0000 mg | ORAL_TABLET | Freq: Two times a day (BID) | ORAL | 0 refills | Status: AC

## 2024-05-02 NOTE — Telephone Encounter (Signed)
Noted  Pt has an appt  KP 

## 2024-05-02 NOTE — Assessment & Plan Note (Signed)
 History of Present Illness Charlene Keith is a 58 year old female who presents with back pain following a Pilates class.   She experiences back pain that began after attending a Pilates class on Saturday. The pain is located in the mid-upper thoracic region, primarily on the left side, and is associated with spasms. No radiation to her legs, circumferentially, no numbness, or tingling is noted. The pain worsens with certain movements and activities, such as holding her weight during exercises.  She has been using cyclobenzaprine  5 mg, which helped her sleep well. She has previously been on steroids for neck issues and is open to using them again.  She is concerned about missing her class tomorrow, as she missed Monday's class and finds it hard on her body if she misses a day. She is worried about the impact of her condition on her ability to perform daily activities, including attending classes and walking on hard floors, which she notes can be challenging due to the pressure it places on her spine.  She has a history of using muscle relaxers and steroids for similar issues in the past, and she is familiar with their effects.  She is exploring posture correction options, including a posture bra, to help with her condition.  Physical Exam PALPATION: Non-tender left sacroiliac joint. Tenderness in the paraspinal region of the left mid-upper thoracic area. SPECIAL TESTS: Positive Kemp's test on the left side.  Assessment and Plan Thoracic spine joint inflammation Thoracic spine joint inflammation with muscle spasms in the left mid-upper thoracic region. Positive Kemp's test indicates joint irritation. Discussed steroid use and potential side effects. Agreed to prednisone treatment. Emphasized activity modification. - Prescribe prednisone twice daily for 5 days, extend to 7 days if needed. - Advise taking prednisone with food. - Instruct to use NSAIDs if pain persists after prednisone. - Recommend  cyclobenzaprine  as needed, caution drowsiness. - Advise on exercises to strengthen and stretch muscles, including pectoralis stretching and thoracic extension exercises. - Send exercise instructions via MyChart. - Discuss potential use of posture bras and alternatives. - Advise modifying Pilates exercises to reduce back strain.  Muscle spasms Muscle spasms due to thoracic spine joint inflammation, likely protective. Discussed muscle relaxers, some relief from cyclobenzaprine . Emphasized rest and gradual return to activity. - Advise taking cyclobenzaprine  5 mg up to three times a day as needed, 10 mg at bedtime if staying home. - Encourage rest and gradual return to activity. - Provide note for work accommodations if needed.

## 2024-05-02 NOTE — Telephone Encounter (Signed)
 Copied from CRM [id number]. Topic: Clinical - Red Word Triage >> May 02, 2024 12:58 PM Hamp Levine R wrote: Red Word that prompted transfer to Nurse Triage: Patient is experiencing severe upper back pain since yesterday.   Chief Complaint: Back pain Symptoms: Left sided back pain  Frequency: Intermittent  Pertinent Negatives: Patient denies numbness or tingling Disposition: [] ED /[] Urgent Care (no appt availability in office) / [x] Appointment(In office/virtual)/ []  [place] [facility]/ [] Home Care/ [] Refused Recommended Disposition /[] [place] [facility]/ []  Follow-up with PCP Additional Notes: Patient reports she has been doing yoga recently and began to experience pain in her left sided back yesterday. She states her pain is intermittent and rates it as 9/10 when present. Patient denies other symptoms at this time. Appointment made for the patient today for evaluation.     Reason for Disposition  [1] SEVERE back pain (e.g., excruciating, unable to do any normal activities) AND [2] not improved 2 hours after pain medicine  Answer Assessment - Initial Assessment Questions 1. ONSET: "When did the pain begin?"      Yesterday  2. LOCATION: "Where does it hurt?" (upper, mid or lower back)     Left mid/upper back  3. SEVERITY: "How bad is the pain?"  (e.g., Scale 1-10; mild, moderate, or severe)   - MILD (1-3): Doesn't interfere with normal activities.    - MODERATE (4-7): Interferes with normal activities or awakens from sleep.    - SEVERE (8-10): Excruciating pain, unable to do any normal activities.      9/10 with movement  4. PATTERN: "Is the pain constant?" (e.g., yes, no; constant, intermittent)      Intermittent  5. RADIATION: "Does the pain shoot into your legs or somewhere else?"     Yesterday felt pain in her stomach  6. CAUSE:  "What do you think is causing the back pain?"      Possibly from yoga  7. BACK OVERUSE:  "Any recent lifting of heavy objects, strenuous work or  exercise?"     Yoga  8. MEDICINES: "What have you taken so far for the pain?" (e.g., nothing, acetaminophen , NSAIDS)     Icy-hot patch  9. NEUROLOGIC SYMPTOMS: "Do you have any weakness, numbness, or problems with bowel/bladder control?"     No 10. OTHER SYMPTOMS: "Do you have any other symptoms?" (e.g., fever, abdomen pain, burning with urination, blood in urine)       No  Protocols used: Back Pain-A-AH

## 2024-05-02 NOTE — Patient Instructions (Signed)
 Patient Action Plan  1. Thoracic Spine Joint Inflammation:    - Take prednisone twice daily for 5 days; extend to 7 days if needed. Always take with food.    - Use NSAIDs if pain persists after finishing prednisone.    - Take cyclobenzaprine  as needed for muscle spasms. Be aware it may cause drowsiness.    - Perform exercises to strengthen and stretch muscles, including pectoralis stretching and thoracic extension exercises. Instructions will be sent via MyChart.    - Consider using posture bras or alternatives; discuss options.    - Modify Pilates exercises to reduce back strain.  2. Muscle Spasms:    - Take cyclobenzaprine  5 mg up to three times a day as needed, or 10 mg at bedtime if staying home.    - Rest and gradually return to normal activities.    - Request a note for work accommodations if necessary.  Red Flags: Contact your healthcare provider if symptoms worsen or new symptoms develop, particularly if you experience significant side effects from medications or increased pain despite treatment.

## 2024-05-02 NOTE — Progress Notes (Signed)
 Primary Care / Sports Medicine Office Visit  Patient Information:  Patient ID: Lynnzie Blackson, female DOB: 05/11/1966 Age: 58 y.o. MRN: [id number]   Natika Geyer is a pleasant 58 y.o. female presenting with the following:  Chief Complaint  Patient presents with   Back Pain    Left sided upper back pain since yesterday 05/01/24. Patient had a pilate's class on Saturday and thinks she may have strained a muscle. Aggravating factors are sitting to long and moving back and forth, twisting and turning. She has taken a cyclobenzaprine  which helped her sleep.     Vitals:   05/02/24 1454  BP: 90/68  Pulse: 95  SpO2: 97%   Vitals:   05/02/24 1454  Weight: 177 lb (80.3 kg)  Height: 5' 2.5" (1.588 m)   Body mass index is 31.86 kg/m.  No results found.   Independent interpretation of notes and tests performed by another provider:   None  Procedures performed:   None  Pertinent History, Exam, Impression, and Recommendations:   Problem List Items Addressed This Visit     Thoracic spine pain - Primary   History of Present Illness Briyonna Omara is a 58 year old female who presents with back pain following a Pilates class.   She experiences back pain that began after attending a Pilates class on Saturday. The pain is located in the mid-upper thoracic region, primarily on the left side, and is associated with spasms. No radiation to her legs, circumferentially, no numbness, or tingling is noted. The pain worsens with certain movements and activities, such as holding her weight during exercises.  She has been using cyclobenzaprine  5 mg, which helped her sleep well. She has previously been on steroids for neck issues and is open to using them again.  She is concerned about missing her class tomorrow, as she missed Monday's class and finds it hard on her body if she misses a day. She is worried about the impact of her condition on her ability to perform daily activities, including  attending classes and walking on hard floors, which she notes can be challenging due to the pressure it places on her spine.  She has a history of using muscle relaxers and steroids for similar issues in the past, and she is familiar with their effects.  She is exploring posture correction options, including a posture bra, to help with her condition.  Physical Exam PALPATION: Non-tender left sacroiliac joint. Tenderness in the paraspinal region of the left mid-upper thoracic area. SPECIAL TESTS: Positive Kemp's test on the left side.  Assessment and Plan Thoracic spine joint inflammation Thoracic spine joint inflammation with muscle spasms in the left mid-upper thoracic region. Positive Kemp's test indicates joint irritation. Discussed steroid use and potential side effects. Agreed to prednisone treatment. Emphasized activity modification. - Prescribe prednisone twice daily for 5 days, extend to 7 days if needed. - Advise taking prednisone with food. - Instruct to use NSAIDs if pain persists after prednisone. - Recommend cyclobenzaprine  as needed, caution drowsiness. - Advise on exercises to strengthen and stretch muscles, including pectoralis stretching and thoracic extension exercises. - Send exercise instructions via MyChart. - Discuss potential use of posture bras and alternatives. - Advise modifying Pilates exercises to reduce back strain.  Muscle spasms Muscle spasms due to thoracic spine joint inflammation, likely protective. Discussed muscle relaxers, some relief from cyclobenzaprine . Emphasized rest and gradual return to activity. - Advise taking cyclobenzaprine  5 mg up to three times a day as needed,  10 mg at bedtime if staying home. - Encourage rest and gradual return to activity. - Provide note for work accommodations if needed.      Relevant Medications   predniSONE  (DELTASONE ) 20 MG tablet     Orders & Medications Medications:  Meds ordered this encounter   Medications   predniSONE  (DELTASONE ) 20 MG tablet    Sig: Take 1 tablet (20 mg total) by mouth 2 (two) times daily with a meal for 7 days.    Dispense:  14 tablet    Refill:  0   No orders of the defined types were placed in this encounter.    No follow-ups on file.     Ma Saupe, MD, [facility]   Primary Care Sports Medicine Primary Care and Sports Medicine at [facility]

## 2024-06-07 ENCOUNTER — Ambulatory Visit: Payer: Self-pay

## 2024-06-07 NOTE — Telephone Encounter (Signed)
 FYI Only or Action Required?: FYI only for provider.  Patient was last seen in primary care on 05/02/2024 by Alvia Selinda PARAS, MD. Called Nurse Triage reporting Otalgia. Symptoms began several days ago. Interventions attempted: Nothing. Symptoms are: gradually worsening.  Triage Disposition: See Physician Within 24 Hours  Patient/caregiver understands and will follow disposition?: Yes, will follow disposition  Copied from CRM [id number]. Topic: Clinical - Red Word Triage >> Jun 07, 2024  9:19 AM Zenovia PARAS wrote: Red Word that prompted transfer to Nurse Triage: Left ear pain.Possible ear infection Reason for Disposition  Earache  (Exceptions: brief ear pain of < 60 minutes duration, earache occurring during air travel  Answer Assessment - Initial Assessment Questions 1. LOCATION: Which ear is involved?     L ear 2. ONSET: When did the ear start hurting      2 days 3. SEVERITY: How bad is the pain?  (Scale 1-10; mild, moderate or severe)   - MILD (1-3): doesn't interfere with normal activities    - MODERATE (4-7): interferes with normal activities or awakens from sleep    - SEVERE (8-10): excruciating pain, unable to do any normal activities      9 4. URI SYMPTOMS: Do you have a runny nose or cough?     denies 5. FEVER: Do you have a fever? If Yes, ask: What is your temperature, how was it measured, and when did it start?     denies 6. CAUSE: Have you been swimming recently?, How often do you use  Q-TIPS?, Have you had any recent air travel or scuba diving?     denies 7. OTHER SYMPTOMS: Do you have any other symptoms? (e.g., headache, stiff neck, dizziness, vomiting, runny nose, decreased hearing)     denies  Denies hearing loss.  Protocols used: Rilla

## 2024-06-07 NOTE — Telephone Encounter (Signed)
 Noted  Pt has a appt.  KP

## 2024-06-08 ENCOUNTER — Ambulatory Visit: Admitting: Family Medicine

## 2024-06-08 ENCOUNTER — Encounter: Payer: Self-pay | Admitting: Family Medicine

## 2024-06-08 VITALS — BP 120/80 | HR 88 | Ht 62.5 in | Wt 176.2 lb

## 2024-06-08 DIAGNOSIS — M25532 Pain in left wrist: Secondary | ICD-10-CM | POA: Diagnosis not present

## 2024-06-08 DIAGNOSIS — M62838 Other muscle spasm: Secondary | ICD-10-CM | POA: Diagnosis not present

## 2024-06-08 DIAGNOSIS — H938X3 Other specified disorders of ear, bilateral: Secondary | ICD-10-CM

## 2024-06-08 MED ORDER — CYCLOBENZAPRINE HCL 5 MG PO TABS: 5.0000 mg | ORAL_TABLET | Freq: Every evening | ORAL | 0 refills | Status: AC | PRN

## 2024-06-08 MED ORDER — IPRATROPIUM BROMIDE 0.03 % NA SOLN: 2.0000 | Freq: Two times a day (BID) | NASAL | 0 refills | Status: AC

## 2024-06-08 MED ORDER — CELECOXIB 100 MG PO CAPS: 100.0000 mg | ORAL_CAPSULE | Freq: Two times a day (BID) | ORAL | 1 refills | Status: DC

## 2024-06-08 NOTE — Progress Notes (Signed)
 Primary Care / Sports Medicine Office Visit  Patient Information:  Patient ID: Charlene Keith, female DOB: 02-21-1966 Age: 58 y.o. MRN: [id number]   Charlene Keith is a pleasant 58 y.o. female presenting with the following:  Chief Complaint  Patient presents with   Ear Pain    Left ear pain and fullness x 2 days.  Has been going on longer.  Took tylenol  for pain but still had fullness.     Vitals:   06/08/24 0830  BP: 120/80  Pulse: 88  SpO2: 99%   Vitals:   06/08/24 0830  Weight: 176 lb 3.2 oz (79.9 kg)  Height: 5' 2.5 (1.588 m)   Body mass index is 31.71 kg/m.  No results found.   Independent interpretation of notes and tests performed by another provider:   None  Procedures performed:   None  Pertinent History, Exam, Impression, and Recommendations:   Problem List Items Addressed This Visit     Ear congestion, bilateral - Primary   History of Present Illness Charlene Keith is a 58 year old female who presents with ear pain.  Otalgia - Recent recurrence (2 days), however has been ongoing since 03/2024 (last addressed during CPE at that time) - No prior history of ear issues - No associated fever, hearing loss, or otorrhea - No recent upper respiratory infection - No history of ear trauma - Had interim thoracic pain addressed with steroids, uncertain if this helped her ear symptoms at that time - Has been compliant with plan outlined at CPE utilizing Flonase and antihistamine - Notes rhinorrhea while showering  Examination demonstrates clear tympanic membranes on the right and benign canal, left tympanic membrane dull, no fluid posteriorly, canal benign, there is prominent paraspinal cervical spasm throughout the upper trapezius regions, no lymphadenopathy, nasal turbinates and oropharynx appear benign, no sinus tenderness.  She has an equivocal to positive left-sided Spurling's test.   Assessment and Plan Bilateral otalgia Ear pain etiology  considered allergic versus related to musculoskeletal cervical concerns given examination findings. - Continue Flonase and antihistamine (Claritin, Zyrtec) - Start Mucinex 12-hour formulation twice daily - Start Atrovent nasal spray - Use Celebrex twice daily, take with food - Use cyclobenzaprine  5-10 mg up to 3 times a day, caution side effect is drowsiness, find the lowest tolerable consistent dosing regimen - Contact interventional spine group for reevaluation of possible repeat cervical spine/neck injections - Referral placed for ENT, follow-up with your group - Contact us  for any questions and follow-up as needed for this issue      Relevant Orders   Ambulatory referral to ENT   Other Visit Diagnoses       Cervical paraspinal muscle spasm       Relevant Medications   cyclobenzaprine  (FLEXERIL ) 5 MG tablet     Left wrist pain       Relevant Medications   cyclobenzaprine  (FLEXERIL ) 5 MG tablet        Orders & Medications Medications:  Meds ordered this encounter  Medications   cyclobenzaprine  (FLEXERIL ) 5 MG tablet    Sig: Take 1-2 tablets (5-10 mg total) by mouth at bedtime as needed for muscle spasms. One half to one tab PO qHS, then increase gradually to one tab TID.    Dispense:  60 tablet    Refill:  0   celecoxib (CELEBREX) 100 MG capsule    Sig: Take 1 capsule (100 mg total) by mouth 2 (two) times daily.    Dispense:  60  capsule    Refill:  1   ipratropium (ATROVENT) 0.03 % nasal spray    Sig: Place 2 sprays into both nostrils every 12 (twelve) hours for 3 days.    Dispense:  30 mL    Refill:  0   Orders Placed This Encounter  Procedures   Ambulatory referral to ENT     No follow-ups on file.     Charlene JINNY Ku, MD, [facility]   Primary Care Sports Medicine Primary Care and Sports Medicine at [facility]

## 2024-06-08 NOTE — Assessment & Plan Note (Addendum)
 History of Present Illness Charlene Keith is a 58 year old female who presents with ear pain.  Otalgia - Recent recurrence (2 days), however has been ongoing since 03/2024 (last addressed during CPE at that time) - No prior history of ear issues - No associated fever, hearing loss, or otorrhea - No recent upper respiratory infection - No history of ear trauma - Had interim thoracic pain addressed with steroids, uncertain if this helped her ear symptoms at that time - Has been compliant with plan outlined at CPE utilizing Flonase and antihistamine - Notes rhinorrhea while showering  Examination demonstrates clear tympanic membranes on the right and benign canal, left tympanic membrane dull, no fluid posteriorly, canal benign, there is prominent paraspinal cervical spasm throughout the upper trapezius regions, no lymphadenopathy, nasal turbinates and oropharynx appear benign, no sinus tenderness.  She has an equivocal to positive left-sided Spurling's test.   Assessment and Plan Bilateral otalgia Ear pain etiology considered allergic versus related to musculoskeletal cervical concerns given examination findings. - Continue Flonase and antihistamine (Claritin, Zyrtec) - Start Mucinex 12-hour formulation twice daily - Start Atrovent nasal spray - Use Celebrex twice daily, take with food - Use cyclobenzaprine  5-10 mg up to 3 times a day, caution side effect is drowsiness, find the lowest tolerable consistent dosing regimen - Contact interventional spine group for reevaluation of possible repeat cervical spine/neck injections - Referral placed for ENT, follow-up with your group - Contact us  for any questions and follow-up as needed for this issue

## 2024-06-08 NOTE — Patient Instructions (Signed)
 Patient Plan for Post-Visit Guidance  1. Continue using Flonase and an antihistamine like Claritin or Zyrtec as directed. 2. Start taking Mucinex 12-hour formulation twice daily. 3. Begin using Atrovent nasal spray as instructed. 4. Take Celebrex twice daily with food to help manage pain. 5. Use cyclobenzaprine  5-10 mg up to three times a day. Be aware that it may cause drowsiness, so find the lowest dose that works for you. 6. Contact the interventional spine group to discuss the possibility of repeat cervical spine/neck injections. 7. Attend the ENT appointment as scheduled for further evaluation. 8. Reach out to us  if you have any questions or need further follow-up.  Red Flags: - If you experience increased pain, new symptoms, or any concerning side effects, contact your healthcare provider immediately.

## 2024-06-09 LAB — COMPREHENSIVE METABOLIC PANEL WITH GFR
ALT: 24 IU/L (ref 0–32)
AST: 24 IU/L (ref 0–40)
Albumin: 4.4 g/dL (ref 3.8–4.9)
Alkaline Phosphatase: 84 IU/L (ref 44–121)
BUN/Creatinine Ratio: 9 (ref 9–23)
BUN: 7 mg/dL (ref 6–24)
Bilirubin Total: 0.5 mg/dL (ref 0.0–1.2)
CO2: 23 mmol/L (ref 20–29)
Calcium: 9.6 mg/dL (ref 8.7–10.2)
Chloride: 103 mmol/L (ref 96–106)
Creatinine, Ser: 0.81 mg/dL (ref 0.57–1.00)
Globulin, Total: 2.7 g/dL (ref 1.5–4.5)
Glucose: 101 mg/dL — ABNORMAL HIGH (ref 70–99)
Potassium: 4.2 mmol/L (ref 3.5–5.2)
Sodium: 140 mmol/L (ref 134–144)
Total Protein: 7.1 g/dL (ref 6.0–8.5)
eGFR: 85 mL/min/1.73 (ref >59)

## 2024-06-09 LAB — HEMOGLOBIN A1C
Est. average glucose Bld gHb Est-mCnc: 114 mg/dL
Hgb A1c MFr Bld: 5.6 % (ref 4.8–5.6)

## 2024-06-09 LAB — CBC
Hematocrit: 42.5 % (ref 34.0–46.6)
Hemoglobin: 14.3 g/dL (ref 11.1–15.9)
MCH: 30.6 pg (ref 26.6–33.0)
MCHC: 33.6 g/dL (ref 31.5–35.7)
MCV: 91 fL (ref 79–97)
Platelets: 406 x10E3/uL (ref 150–450)
RBC: 4.68 x10E6/uL (ref 3.77–5.28)
RDW: 13.1 % (ref 11.7–15.4)
WBC: 6.2 x10E3/uL (ref 3.4–10.8)

## 2024-06-09 LAB — LIPID PANEL
Chol/HDL Ratio: 3.7 ratio (ref 0.0–4.4)
Cholesterol, Total: 232 mg/dL — ABNORMAL HIGH (ref 100–199)
HDL: 63 mg/dL (ref >39)
LDL Chol Calc (NIH): 149 mg/dL — ABNORMAL HIGH (ref 0–99)
Triglycerides: 116 mg/dL (ref 0–149)
VLDL Cholesterol Cal: 20 mg/dL (ref 5–40)

## 2024-06-13 ENCOUNTER — Ambulatory Visit: Payer: Self-pay | Admitting: Family Medicine

## 2024-08-02 ENCOUNTER — Encounter: Payer: Self-pay | Admitting: Family Medicine

## 2024-08-03 NOTE — Telephone Encounter (Signed)
 Please review and send RX. Thanks   JM

## 2024-08-06 ENCOUNTER — Other Ambulatory Visit: Payer: Self-pay | Admitting: Family Medicine

## 2024-08-08 NOTE — Telephone Encounter (Signed)
 Requested Prescriptions  Pending Prescriptions Disp Refills   celecoxib  (CELEBREX ) 100 MG capsule [Pharmacy Med Name: CELECOXIB  100 MG CAPSULE] 60 capsule 1    Sig: TAKE 1 CAPSULE BY MOUTH TWICE A DAY     Analgesics:  COX2 Inhibitors Failed - 08/08/2024 12:35 PM      Failed - Manual Review: Labs are only required if the patient has taken medication for more than 8 weeks.      Passed - HGB in normal range and within 360 days    Hemoglobin  Date Value Ref Range Status  06/08/2024 14.3 11.1 - 15.9 g/dL Final         Passed - Cr in normal range and within 360 days    Creatinine, Ser  Date Value Ref Range Status  06/08/2024 0.81 0.57 - 1.00 mg/dL Final         Passed - HCT in normal range and within 360 days    Hematocrit  Date Value Ref Range Status  06/08/2024 42.5 34.0 - 46.6 % Final         Passed - AST in normal range and within 360 days    AST  Date Value Ref Range Status  06/08/2024 24 0 - 40 IU/L Final         Passed - ALT in normal range and within 360 days    ALT  Date Value Ref Range Status  06/08/2024 24 0 - 32 IU/L Final         Passed - eGFR is 30 or above and within 360 days    eGFR  Date Value Ref Range Status  06/08/2024 85 >59 mL/min/1.73 Final         Passed - Patient is not pregnant      Passed - Valid encounter within last 12 months    Recent Outpatient Visits           2 months ago Ear congestion, bilateral   [place] [facility], Selinda PARAS, MD   3 months ago Thoracic spine pain   [place] [facility], Selinda PARAS, MD   4 months ago Healthcare maintenance   [facility] at [facility], Selinda PARAS, MD

## 2024-10-31 NOTE — Progress Notes (Signed)
 Referring Physician:  Alvia Selinda PARAS, MD [address] [place],  [place] [address]  Primary Physician:  Alvia Selinda PARAS, MD  History of Present Illness: 11/09/2024 [name] has a history of OSA with CPAP, chronic pain syndrome, ovarian cyst.   She has intermittent neck pain with radiation to both shoulders and into her upper arm, left > right x years. She has numbness and tingling in her hands and feet. She is dropping things intermittently and has intermittent weakness. She has improvement with rest, ice, TENs, and flexeril . Had help with previous PT over a year ago. Pain is worse with cleaning.   Tobacco use: Does not smoke.   Bowel/Bladder Dysfunction: none  Conservative measures:  Physical therapy:  has not participated in last year, helped previously for her neck Multimodal medical therapy including regular antiinflammatories:  Flexeril , Tylenol , Celebrex , Diclofenac , Tramadol  Injections:  01/21/2022-ESI Left C7-T1 11/26/2021-ESI Left C7-T1  Past Surgery:  no spine surgery  Charlene Keith has no symptoms of cervical myelopathy.  The symptoms are causing a significant impact on the patient's life.   Review of Systems:  A 10 point review of systems is negative, except for the pertinent positives and negatives detailed in the HPI.  Past Medical History: Past Medical History:  Diagnosis Date   Left breast mass 2023   benign x 2   Ovarian cyst    Pain of left sacroiliac joint 05/19/2021    Past Surgical History: Past Surgical History:  Procedure Laterality Date   ABDOMINAL HYSTERECTOMY  2013   Cervix removed - [facility] [place], [place])   BREAST LUMPECTOMY WITH RADIOACTIVE SEED LOCALIZATION Left 01/02/2022   Procedure: LEFT BREAST LUMPECTOMY WITH RADIOACTIVE SEED LOCALIZATION;  Surgeon: Vernetta Berg, MD;  Location: [facility] OR;  Service: General;  Laterality: Left;   COLONOSCOPY  2014   NOSE SURGERY  1990   WISDOM TOOTH  EXTRACTION Bilateral 2010    Allergies: Allergies as of 11/09/2024 - Review Complete 11/09/2024  Allergen Reaction Noted   Penicillins Rash 07/14/2011    Medications: Outpatient Encounter Medications as of 11/09/2024  Medication Sig   acetaminophen  (TYLENOL ) 325 MG tablet Take 162.5 mg by mouth every 6 (six) hours as needed for moderate pain.   cetirizine (ZYRTEC) 10 MG tablet Take 10 mg by mouth daily as needed for allergies.   Cyanocobalamin (B-12 PO) Take 1 capsule by mouth daily.   cyclobenzaprine  (FLEXERIL ) 5 MG tablet Take 1-2 tablets (5-10 mg total) by mouth at bedtime as needed for muscle spasms. One half to one tab PO qHS, then increase gradually to one tab TID.   estradiol  (CLIMARA  - DOSED IN MG/24 HR) 0.05 mg/24hr patch Place 1 patch (0.05 mg total) onto the skin every Monday.   famotidine (PEPCID) 20 MG tablet Take 20 mg by mouth daily as needed for heartburn or indigestion.   fluticasone (CUTIVATE) 0.005 % ointment Apply topically as needed.   hydrocortisone  (ANUSOL -HC) 2.5 % rectal cream Place 1 Application rectally 2 (two) times daily.   celecoxib  (CELEBREX ) 100 MG capsule TAKE 1 CAPSULE BY MOUTH TWICE A DAY   ipratropium (ATROVENT ) 0.03 % nasal spray Place 2 sprays into both nostrils every 12 (twelve) hours for 3 days.   Magnesium 500 MG TABS Take 500 mg by mouth daily. (Patient not taking: Reported on 04/03/2024)   pantoprazole  (PROTONIX ) 40 MG tablet TAKE 1 TABLET BY MOUTH EVERY DAY (Patient not taking: Reported on 04/03/2024)   tretinoin (RETIN-A) 0.025 % cream  Apply 1 application topically at bedtime.   VITAMIN D  PO Take 1 capsule by mouth daily. (Patient not taking: Reported on 05/02/2024)   No facility-administered encounter medications on file as of 11/09/2024.    Social History: Social History   Tobacco Use   Smoking status: Former    Current packs/day: 0.00    Types: Cigarettes    Quit date: 1997    Years since quitting: 28.9   Smokeless tobacco: Never   Vaping Use   Vaping status: Never Used  Substance Use Topics   Alcohol use: Not Currently   Drug use: Never    Family Medical History: Family History  Problem Relation Age of Onset   Hypertension Mother    Lymphoma Mother    Healthy Father     Physical Examination: There were no vitals filed for this visit.  General: Patient is well developed, well nourished, calm, collected, and in no apparent distress. Attention to examination is appropriate.  Respiratory: Patient is breathing without any difficulty.   NEUROLOGICAL:     Awake, alert, oriented to person, place, and time.  Speech is clear and fluent. Fund of knowledge is appropriate.   Cranial Nerves: Pupils equal round and reactive to light.  Facial tone is symmetric.    Minimal posterior cervical tenderness. Mild tenderness in bilateral trapezial region.   Good ROM of both shoulders with no pain.   No abnormal lesions on exposed skin.   Strength: Side Biceps Triceps Deltoid Interossei Grip Wrist Ext. Wrist Flex.  R 5 5 5 5 5 5 5   L 5 5 5 5 5 5 5    Side Iliopsoas Quads Hamstring PF DF EHL  R 5 5 5 5 5 5   L 5 5 5 5 5 5    Reflexes are 2+ and symmetric at the biceps, brachioradialis, patella and achilles.   Hoffman's is absent.  Clonus is not present.   Bilateral upper and lower extremity sensation is intact to light touch.     No pain with IR/ER of both hips.   Gait is normal.     Medical Decision Making  Imaging: Cervical MRI dated 09/03/21:  FINDINGS: Alignment: Mild cervical levocurvature. Reversal of the expected cervical lordosis. No significant spondylolisthesis.   Vertebrae: Redemonstrated mild chronic anterior wedge deformity of the T1 vertebral body. Vertebral body height is otherwise maintained. No significant marrow edema or focal suspicious osseous lesion.   Cord: No signal abnormality identified within the cervical spinal cord.   Posterior Fossa, vertebral arteries, paraspinal tissues:  No abnormality identified within included portions of the posterior fossa. Flow voids preserved within the imaged cervical vertebral arteries. Paraspinal soft tissues unremarkable.   Disc levels:   Multilevel disc degeneration, greatest at C4-C5 (moderate) and C5-C6 (moderate/advanced).   C2-C3: Minimal facet arthrosis. No significant disc herniation or stenosis.   C3-C4: No significant disc herniation or stenosis.   C4-C5: Disc bulge. Superimposed small left center disc protrusion. The disc protrusion results in mild spinal canal narrowing (with contact upon the ventral spinal cord). No significant foraminal stenosis.   C5-C6: Disc bulge with endplate spurring and right greater than left uncovertebral hypertrophy. The disc bulge results in mild relative spinal canal narrowing brain (with contact upon the ventral spinal cord). Mild/moderate right neural foraminal narrowing.   C6-C7: Possible small central zone posterior annular fissure. Shallow disc bulge. Minimal partial effacement of the ventral thecal sac (without spinal cord mass effect). No significant foraminal stenosis.   C7-T1: No significant disc herniation  or stenosis.   T1-T2: Imaged sagittally. Disc bulge. Mild partial effacement of the ventral thecal sac (without spinal cord mass effect). No appreciable significant foraminal stenosis.   IMPRESSION: Cervical and upper thoracic spondylosis, as outlined and with findings most notably as follows.   At C4-C5, there is moderate disc degeneration. Disc bulge. Superimposed small left center disc protrusion. The disc protrusion results in mild spinal canal narrowing (with contact upon the ventral spinal cord). No significant foraminal stenosis.   At C5-C6, there is moderate/advanced disc degeneration. Disc bulge with endplate spurring and right greater than left uncovertebral hypertrophy. The disc bulge results in mild relative spinal canal narrowing (with contact  upon the ventral spinal cord). Mild/moderate right neural foraminal narrowing.   At C6-C7, there is mild disc degeneration. Possible small central zone posterior annular fissure. Shallow disc bulge. Minimal partial effacement of the ventral thecal sac (without spinal cord mass effect). No significant foraminal stenosis.   At T1-T2, a disc bulge contributes to mild relative spinal canal narrowing (without spinal cord mass effect).   Reversal of the expected cervical lordosis   Mild cervical levocurvature.     Electronically Signed   By: Rockey Childs D.O.   On: 09/03/2021 14:54     I have personally reviewed the images and agree with the above interpretation.  Assessment and Plan: Ms. Demeter has chronic intermittent neck pain with radiation to both shoulders and into her upper arm, left > right x years. She is dropping things intermittently and has intermittent weakness.   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).   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   Previously, she did well with PT and injections.   Treatment options discussed with patient and following plan made:  - MRI of cervical spine to further evaluate neck and arm pain.  - Depending on MRI results, will likely consider PT and referral back to Dr. Marcelino for injections.   - Will schedule phone/mychart/in-person visit to review MRI results once I get them back.   She mentions concerns about left arm pain being cardiac in nature. She has no current CP, SOB. She will f/u with PCP about this.   I spent a total of 30 minutes in face-to-face and non-face-to-face activities related to this patient's care today including review of outside records, review of imaging, review of symptoms, physical exam, discussion of differential diagnosis, discussion of treatment options, and documentation.    Thank you for involving me in the care of this patient.   [name] PA-C Dept. of Neurosurgery

## 2024-11-09 ENCOUNTER — Encounter: Payer: Self-pay | Admitting: Orthopedic Surgery

## 2024-11-09 ENCOUNTER — Ambulatory Visit: Admitting: Orthopedic Surgery

## 2024-11-09 VITALS — BP 102/68 | Ht 65.0 in | Wt 164.2 lb

## 2024-11-09 DIAGNOSIS — M542 Cervicalgia: Secondary | ICD-10-CM

## 2024-11-09 DIAGNOSIS — M5412 Radiculopathy, cervical region: Secondary | ICD-10-CM

## 2024-11-09 DIAGNOSIS — M4802 Spinal stenosis, cervical region: Secondary | ICD-10-CM

## 2024-11-09 DIAGNOSIS — M4722 Other spondylosis with radiculopathy, cervical region: Secondary | ICD-10-CM

## 2024-11-09 DIAGNOSIS — M5030 Other cervical disc degeneration, unspecified cervical region: Secondary | ICD-10-CM | POA: Diagnosis not present

## 2024-11-09 DIAGNOSIS — M47812 Spondylosis without myelopathy or radiculopathy, cervical region: Secondary | ICD-10-CM

## 2024-11-09 NOTE — Patient Instructions (Signed)
 It was so nice to see you today. Thank you so much for coming in.    Your neck MRI from 2022 showed some wear and tear (arthritis) in your neck.   I want to get an updated MRI of your neck to look into things further. We will get this approved through your insurance and [facility] will call you to schedule the appointment. Ask about your patient responsibility. You do not need to pay this prior to getting MRI, they can bill you.   [facility] is located at [address] in [place]. This is near the intersection of [address] and [address]   After you have the MRI, it can take 14-28 days for me to get the results back. If I don't have them in 2 weeks, we will call to try to get the results.   Once I have the results, we will call you to schedule a follow up visit with me to review them. We can do a phone visit, MyChart visit, or in person visit.   Depending on your MRI results, we likely will consider physical therapy and possible repeat injections in your neck.   Follow up with your PCP with any concerns about your heart or cardiac symptoms.   Please do not hesitate to call if you have any questions or concerns. You can also message me in MyChart.   [name] PA-C [phone]     The physicians and staff at [facility] at [facility] are committed to providing excellent care. You may receive a survey asking for feedback about your experience at our office. We value you your feedback and appreciate you taking the time to to fill it out. The [facility] leadership team is also available to discuss your experience in person, feel free to contact us  [phone].

## 2024-11-23 ENCOUNTER — Encounter: Payer: Self-pay | Admitting: Orthopedic Surgery

## 2024-11-27 ENCOUNTER — Inpatient Hospital Stay
Admission: RE | Admit: 2024-11-27 | Discharge: 2024-11-27 | Attending: Orthopedic Surgery | Admitting: Orthopedic Surgery

## 2024-11-27 DIAGNOSIS — M5412 Radiculopathy, cervical region: Secondary | ICD-10-CM

## 2024-11-27 DIAGNOSIS — M5030 Other cervical disc degeneration, unspecified cervical region: Secondary | ICD-10-CM

## 2024-11-27 DIAGNOSIS — M542 Cervicalgia: Secondary | ICD-10-CM

## 2024-11-27 DIAGNOSIS — M47812 Spondylosis without myelopathy or radiculopathy, cervical region: Secondary | ICD-10-CM

## 2024-12-11 NOTE — Progress Notes (Signed)
 "  My Chart Video Visit- Progress Note: Referring Physician:  Alvia Selinda PARAS, MD [address] [place],  [place] [address]  Primary Physician:  Charlene Selinda PARAS, MD  This visit was performed via MyChart/video.   Patient location: home Provider location: working from home  I spent a total of 15 minutes non-face-to-face activities for this visit on the date of this encounter including review of current clinical condition and response to treatment.    Patient has given verbal consent to this MyChart video visit and we reviewed the limitations of a MyChart video visit. Patient wishes to proceed.    Chief Complaint:  review imaging   History of Present Illness: Charlene Keith is a 59 y.o. female has a history of  OSA with CPAP, chronic pain syndrome, ovarian cyst.  Last seen by me on 11/09/24 for intermittent neck pain with radiation to both shoulders into her upper arm, left > right, x years.   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    Previously, she did well with PT and injections.   MyChart visit scheduled to review her cervical MRI.   She continues with intermittent neck pain with radiation to both shoulders and into her upper arm. Pain usually in shoulders but can go into biceps. She has numbness and tingling in her hands along with intermittent weakness. She has dropped her coffee cup. She feels stiffness in both hands. Pain in her neck is more of a pinching feeling.   Tobacco use: Does not smoke.    Bowel/Bladder Dysfunction: none   Conservative measures:  Physical therapy:  has not participated in last year, helped previously for her neck Multimodal medical therapy including regular antiinflammatories:  Flexeril , Tylenol , Celebrex , Diclofenac , Tramadol  Injections:  01/21/2022-ESI Left C7-T1 11/26/2021-ESI Left C7-T1   Past Surgery:  no spine  surgery   Charlene Keith has no symptoms of cervical myelopathy.   The symptoms are causing a significant impact on the patient's life.    Exam: General: Patient is well developed, well nourished, calm, collected, and in no apparent distress. Attention to examination is appropriate.  Respiratory: Patient is breathing without any difficulty.    Awake, alert, oriented to person, place, and time.  Speech is clear and fluent. Fund of knowledge is appropriate.     Imaging: Cervical MRI dated 11/27/24: FINDINGS:   BONES AND ALIGNMENT: Normal alignment. Normal vertebral body heights. Bone marrow signal is unremarkable.   SPINAL CORD: Normal spinal cord size. No abnormal spinal cord signal.   SOFT TISSUES: No paraspinal mass.   C2-C3: No significant disc herniation. No spinal canal stenosis or neural foraminal narrowing.   C3-C4: Small central disc protrusion. No central spinal canal or neural foraminal stenosis.   C4-C5: Small disc bulge with uncovertebral hypertrophy. No central spinal canal or neural foraminal stenosis.   C5-C6: Small disc osteophyte complex, unchanged. No central spinal canal or neural foraminal stenosis.   C6-C7: Small left asymmetric disc bulge. No spinal canal or neural foraminal stenosis.   C7-T1: No significant disc herniation. No spinal canal stenosis or neural foraminal narrowing.   IMPRESSION: 1. Small disc bulges and protrusions at C3-4, C4-5, C5-6, and C6-7 without central spinal canal or neural foraminal stenosis. 2. No acute findings.   Electronically signed by: Charlene Stanford MD 12/08/2024 12:56 AM EST RP Workstation: [id number]  I have personally reviewed the images and agree with the  above interpretation.  Assessment and Plan: Charlene Keith has chronic intermittent neck pain with radiation to both shoulders and into her upper arm. She has numbness and tingling in her hands along with intermittent weakness. She has dropped her  coffee cup.    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).   Above cervical MRI shows multilevel spondylosis with no compression noted.   Neck pain likely due to underlying spondylosis. Not sure of cause of arm pain. Symptoms suspicious for carpal tunnel syndrome.    Treatment options discussed with patient and following plan made:  - Referral back to Dr. Lateef for repeat cervical injections. She will let me know if she does not hear anything by next week.  - Discussed PT for cervical spine. She declines for now.  - Recommend EMG/NCS of upper extremities and orders to [facility].  - Will plan to follow up after her EMG to discuss results.   Charlene Boys PA-C Neurosurgery "

## 2024-12-13 ENCOUNTER — Telehealth: Payer: Self-pay | Admitting: Orthopedic Surgery

## 2024-12-13 NOTE — Telephone Encounter (Signed)
 Please disregard the below message, sent in error.

## 2024-12-13 NOTE — Telephone Encounter (Signed)
 Charlene Keith 12/13/2024 09:42 AM EST lvm ------------------------------------ Charlene Keith 12/11/2024 04:25 PM EST lvm ------------------------------------ Charlene Keith 12/08/2024 03:24 PM EST Lvm  Per Glade: Please schedule f/u with me to review her imaging. Can do phone, mychart, or in person. It's up to her.   Thanks!

## 2024-12-15 ENCOUNTER — Telehealth: Admitting: Orthopedic Surgery

## 2024-12-15 ENCOUNTER — Encounter: Payer: Self-pay | Admitting: Orthopedic Surgery

## 2024-12-15 ENCOUNTER — Encounter: Payer: Self-pay | Admitting: Neurology

## 2024-12-15 ENCOUNTER — Other Ambulatory Visit: Payer: Self-pay

## 2024-12-15 DIAGNOSIS — M79601 Pain in right arm: Secondary | ICD-10-CM

## 2024-12-15 DIAGNOSIS — R202 Paresthesia of skin: Secondary | ICD-10-CM | POA: Diagnosis not present

## 2024-12-15 DIAGNOSIS — R200 Anesthesia of skin: Secondary | ICD-10-CM

## 2024-12-15 DIAGNOSIS — M47812 Spondylosis without myelopathy or radiculopathy, cervical region: Secondary | ICD-10-CM | POA: Diagnosis not present

## 2024-12-15 DIAGNOSIS — M79602 Pain in left arm: Secondary | ICD-10-CM

## 2025-01-09 ENCOUNTER — Ambulatory Visit: Payer: Self-pay

## 2025-01-09 ENCOUNTER — Ambulatory Visit: Admitting: Family Medicine

## 2025-01-09 ENCOUNTER — Encounter: Payer: Self-pay | Admitting: Family Medicine

## 2025-01-09 VITALS — BP 138/70 | HR 111 | Temp 98.3°F | Ht 65.0 in | Wt 174.1 lb

## 2025-01-09 DIAGNOSIS — J069 Acute upper respiratory infection, unspecified: Secondary | ICD-10-CM

## 2025-01-09 MED ORDER — PROMETHAZINE-DM 6.25-15 MG/5ML PO SYRP: 5.0000 mL | ORAL_SOLUTION | Freq: Four times a day (QID) | ORAL | 0 refills | Status: AC | PRN

## 2025-01-09 MED ORDER — DOXYCYCLINE HYCLATE 100 MG PO TABS: 100.0000 mg | ORAL_TABLET | Freq: Two times a day (BID) | ORAL | 0 refills | Status: AC

## 2025-01-09 NOTE — Telephone Encounter (Signed)
Noted  Pt has an appt  KP 

## 2025-01-09 NOTE — Progress Notes (Signed)
 "  Acute Office Visit  Subjective:     Patient ID: Charlene Keith, female    DOB: May 04, 1966, 59 y.o.   MRN: [id number]  Chief Complaint  Patient presents with   Sore Throat   Respiratory Distress    Pt has a hard time brearthing   Shortness of Breath   Cough    Pt has had cough since Thursday.    Discussed the use of AI scribe software for clinical note transcription with the patient, who gave verbal consent to proceed.  History of Present Illness Charlene Keith is a 59 year old female who presents with a cough, sore throat, and difficulty breathing.  She has been experiencing a cough and sore throat for the past five days, with the last two days being particularly severe. The cough is accompanied by difficulty breathing, especially when coughing, and she almost choked the previous day. She also notes a runny nose and feeling very cold, particularly at night. No fever or chills are present, but she reports feeling very cold, especially at night.  She has not taken any medications for these symptoms, as she prefers to follow her husband Jason's guidance on medication use. She mentions that everyone at home, including her son Alm and her husband, has been sick, but her symptoms are more severe.  She describes her bedroom as being on top of the garage, which makes it cold at night, requiring her to sleep in sleeping bags to stay warm.     Review of Systems  All other systems reviewed and are negative.       Objective:    BP 138/70   Pulse (!) 111   Temp 98.3 F (36.8 C)   Ht 5' 5 (1.651 m)   Wt 174 lb 1.6 oz (79 kg)   SpO2 97%   BMI 28.97 kg/m     Physical Exam Vitals and nursing note reviewed.  Constitutional:      Appearance: Normal appearance.  HENT:     Head: Normocephalic.     Right Ear: External ear normal.     Left Ear: External ear normal.  Eyes:     Conjunctiva/sclera: Conjunctivae normal.  Cardiovascular:     Rate and Rhythm: Normal rate.   Pulmonary:     Effort: Pulmonary effort is normal. No respiratory distress.  Abdominal:     Palpations: Abdomen is soft.  Musculoskeletal:        General: Normal range of motion.  Skin:    General: Skin is warm.  Neurological:     Mental Status: She is alert and oriented to person, place, and time.  Psychiatric:        Mood and Affect: Mood normal.     No results found for any visits on 01/09/25.      Assessment & Plan:   Assessment & Plan Viral upper respiratory tract infection  Orders:   doxycycline  (VIBRA -TABS) 100 MG tablet; Take 1 tablet (100 mg total) by mouth 2 (two) times daily for 7 days.   promethazine -dextromethorphan (PROMETHAZINE -DM) 6.25-15 MG/5ML syrup; Take 5 mLs by mouth 4 (four) times daily as needed for cough.   Assessment and Plan Assessment & Plan Acute upper respiratory infection Sore throat, cough, and difficulty breathing for five days, worsening over the last two days. Symptoms include rhinorrhea and chills, likely contracted from family members. Symptoms have exceeded the typical window for influenza treatment. - Prescribed a seven-day course of doxycycline . - Recommended cough syrup for symptomatic  relief. - Advised use of OTC Chloraseptic spray / Tylenol  for throat discomfort. - Recommended honey and lemon in tea for soothing the throat. - Advised salt water gargling for throat relief. - Suggested switching to naproxen if acetaminophen  is insufficient.     Return if symptoms worsen or fail to improve.  Telly Broberg K Liston Thum, MD  "

## 2025-01-22 ENCOUNTER — Encounter: Payer: Self-pay | Admitting: Neurology
